# Patient Record
Sex: Male | Born: 1935
Health system: Southern US, Community
[De-identification: ages and names within clinical notes are randomized; demographics above are authoritative.]

## PROBLEM LIST (undated history)

## (undated) DIAGNOSIS — I10 Essential (primary) hypertension: Secondary | ICD-10-CM

## (undated) DIAGNOSIS — J45909 Unspecified asthma, uncomplicated: Secondary | ICD-10-CM

## (undated) DIAGNOSIS — Z789 Other specified health status: Secondary | ICD-10-CM

## (undated) HISTORY — DX: Other specified health status: Z78.9

## (undated) HISTORY — PX: COLONOSCOPY: SHX174

## (undated) HISTORY — DX: Unspecified asthma, uncomplicated: J45.909

---

## 2014-01-13 DIAGNOSIS — M7710 Lateral epicondylitis, unspecified elbow: Secondary | ICD-10-CM | POA: Diagnosis not present

## 2014-10-13 DIAGNOSIS — H2513 Age-related nuclear cataract, bilateral: Secondary | ICD-10-CM | POA: Diagnosis not present

## 2014-10-13 DIAGNOSIS — H35372 Puckering of macula, left eye: Secondary | ICD-10-CM | POA: Diagnosis not present

## 2015-02-02 DIAGNOSIS — R079 Chest pain, unspecified: Secondary | ICD-10-CM | POA: Diagnosis not present

## 2015-09-08 DIAGNOSIS — R1011 Right upper quadrant pain: Secondary | ICD-10-CM | POA: Diagnosis not present

## 2015-09-21 DIAGNOSIS — Z23 Encounter for immunization: Secondary | ICD-10-CM | POA: Diagnosis not present

## 2015-09-23 DIAGNOSIS — R1906 Epigastric swelling, mass or lump: Secondary | ICD-10-CM | POA: Diagnosis not present

## 2015-09-25 ENCOUNTER — Other Ambulatory Visit: Payer: Self-pay | Admitting: General Surgery

## 2015-09-25 DIAGNOSIS — R1906 Epigastric swelling, mass or lump: Secondary | ICD-10-CM

## 2015-10-06 ENCOUNTER — Ambulatory Visit
Admission: RE | Admit: 2015-10-06 | Discharge: 2015-10-06 | Disposition: A | Discharge status: Home / Self Care | Payer: Medicare Other | Source: Ambulatory Visit | Attending: General Surgery | Admitting: General Surgery

## 2015-10-06 DIAGNOSIS — R1906 Epigastric swelling, mass or lump: Secondary | ICD-10-CM

## 2015-10-06 MED ORDER — IOPAMIDOL (ISOVUE-300) INJECTION 61%
100.0000 mL | Freq: Once | INTRAVENOUS | Status: AC | PRN
  Administered 2015-10-06: 100 mL via INTRAVENOUS

## 2015-12-20 DIAGNOSIS — K469 Unspecified abdominal hernia without obstruction or gangrene: Secondary | ICD-10-CM | POA: Diagnosis not present

## 2015-12-20 DIAGNOSIS — N400 Enlarged prostate without lower urinary tract symptoms: Secondary | ICD-10-CM | POA: Diagnosis not present

## 2015-12-20 DIAGNOSIS — J45909 Unspecified asthma, uncomplicated: Secondary | ICD-10-CM | POA: Diagnosis not present

## 2015-12-20 DIAGNOSIS — L821 Other seborrheic keratosis: Secondary | ICD-10-CM | POA: Diagnosis not present

## 2015-12-20 DIAGNOSIS — K862 Cyst of pancreas: Secondary | ICD-10-CM | POA: Diagnosis not present

## 2015-12-20 DIAGNOSIS — L299 Pruritus, unspecified: Secondary | ICD-10-CM | POA: Diagnosis not present

## 2015-12-20 DIAGNOSIS — R972 Elevated prostate specific antigen [PSA]: Secondary | ICD-10-CM | POA: Diagnosis not present

## 2015-12-20 DIAGNOSIS — Z23 Encounter for immunization: Secondary | ICD-10-CM | POA: Diagnosis not present

## 2016-03-17 DIAGNOSIS — N400 Enlarged prostate without lower urinary tract symptoms: Secondary | ICD-10-CM | POA: Diagnosis not present

## 2016-03-17 DIAGNOSIS — Z13220 Encounter for screening for lipoid disorders: Secondary | ICD-10-CM | POA: Diagnosis not present

## 2016-03-17 DIAGNOSIS — Z0000 Encounter for general adult medical examination without abnormal findings: Secondary | ICD-10-CM | POA: Diagnosis not present

## 2016-03-17 DIAGNOSIS — K862 Cyst of pancreas: Secondary | ICD-10-CM | POA: Diagnosis not present

## 2016-03-17 DIAGNOSIS — J45909 Unspecified asthma, uncomplicated: Secondary | ICD-10-CM | POA: Diagnosis not present

## 2016-03-17 DIAGNOSIS — Z23 Encounter for immunization: Secondary | ICD-10-CM | POA: Diagnosis not present

## 2016-03-17 DIAGNOSIS — L821 Other seborrheic keratosis: Secondary | ICD-10-CM | POA: Diagnosis not present

## 2016-03-17 DIAGNOSIS — R972 Elevated prostate specific antigen [PSA]: Secondary | ICD-10-CM | POA: Diagnosis not present

## 2016-03-27 DIAGNOSIS — H2513 Age-related nuclear cataract, bilateral: Secondary | ICD-10-CM | POA: Diagnosis not present

## 2016-07-21 IMAGING — CT CT ABD-PELV W/ CM
2 of 5 series · 14 of 46 positions shown, 16 images · IV contrast (APPLIED)
Comparison: None.

ADDENDUM:
As mentioned in the body of the report, there is a 7 mm cystic
lesion in the body of pancreas. No evidence for associated
enhancement or substantial dilatation of the main pancreatic duct.
Followup MRI without and with contrast in 12 months is recommended
to further evaluate. This recommendation follows ACR consensus
guidelines: Managing Incidental Findings on Abdominal CT: White
Paper of the ACR Incidental Findings Committee. [HOSPITAL]
8010;[DATE]
CLINICAL DATA: Initial encounter for epigastric abdominal wall mass
on physical exam.

EXAM:
CT ABDOMEN AND PELVIS WITH CONTRAST
TECHNIQUE: Multidetector CT imaging of the abdomen and pelvis was performed
using the standard protocol following bolus administration of
intravenous contrast.
CONTRAST:  100mL 1BJCRJ-KOO IOPAMIDOL (1BJCRJ-KOO) INJECTION 61%

[Series 2: abd/pelvis w/cm · axial · 0.68mm/px · z∈[+357,+817]mm · 11 of 104 slices shown, 13 images]
[im 6/104  soft-tissue]
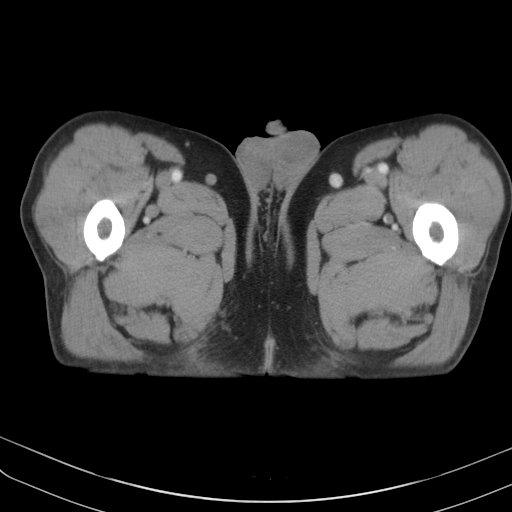
[im 6/104  bone]
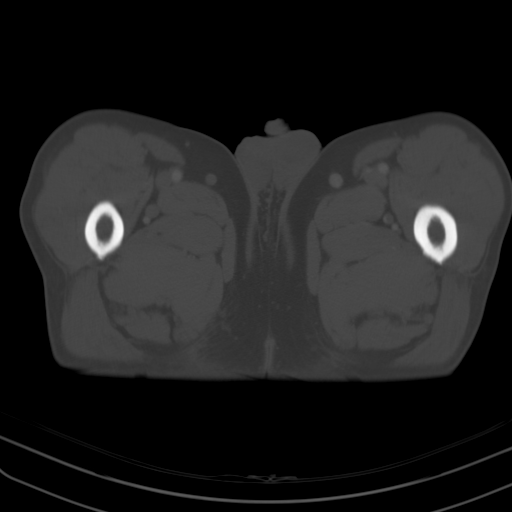
[im 16/104  soft-tissue]
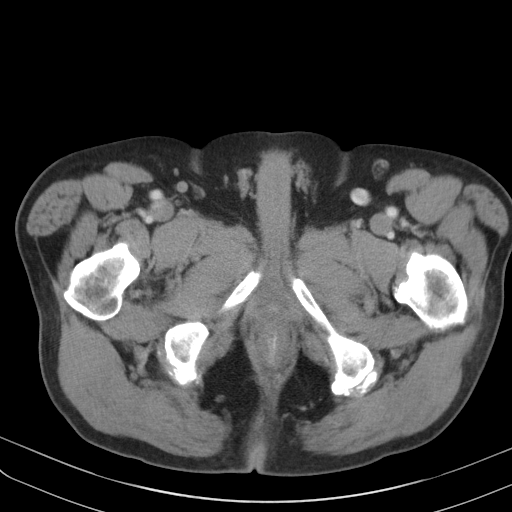
[im 26/104  soft-tissue]
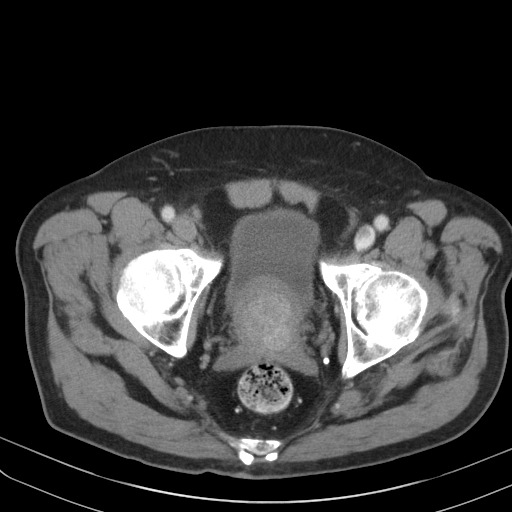
[im 37/104  soft-tissue]
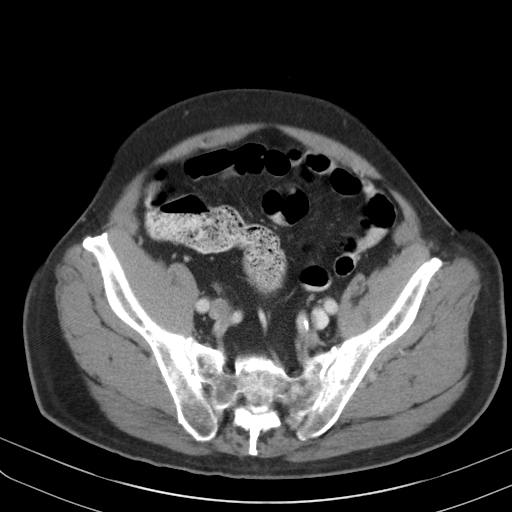
[im 42/104  soft-tissue]
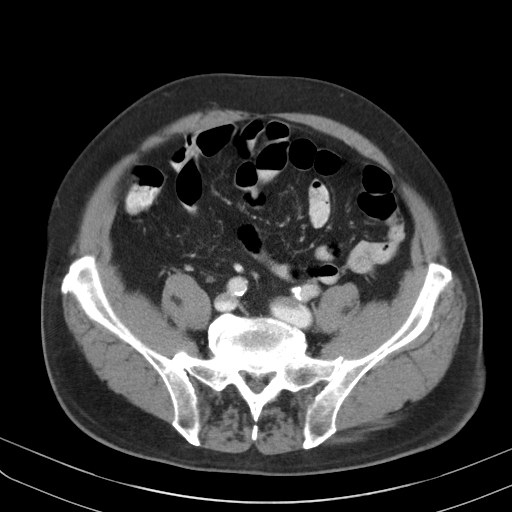
[im 52/104  soft-tissue]
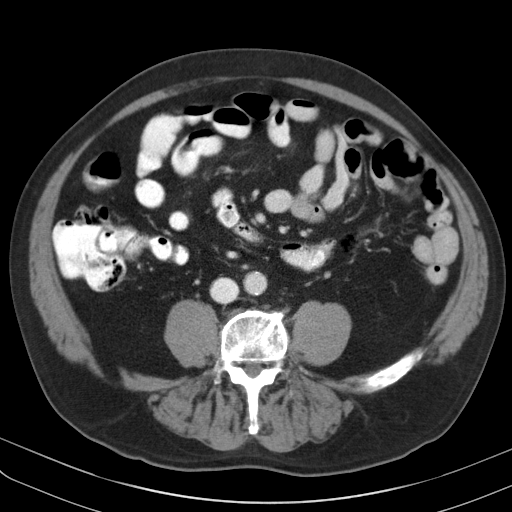
[im 62/104  soft-tissue]
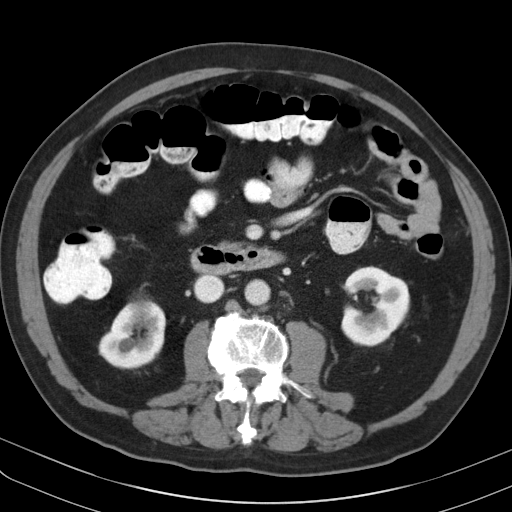
[im 67/104  soft-tissue]
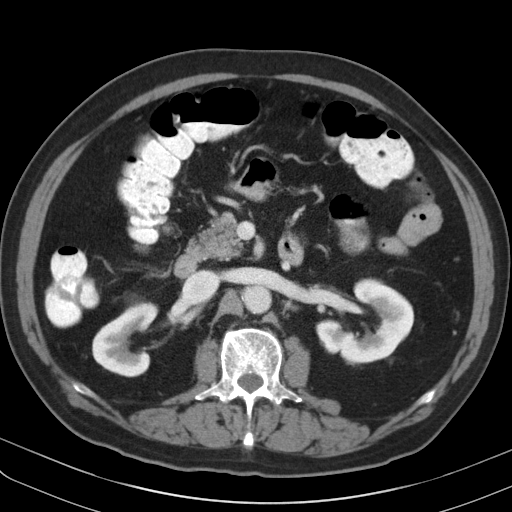
[im 78/104  soft-tissue]
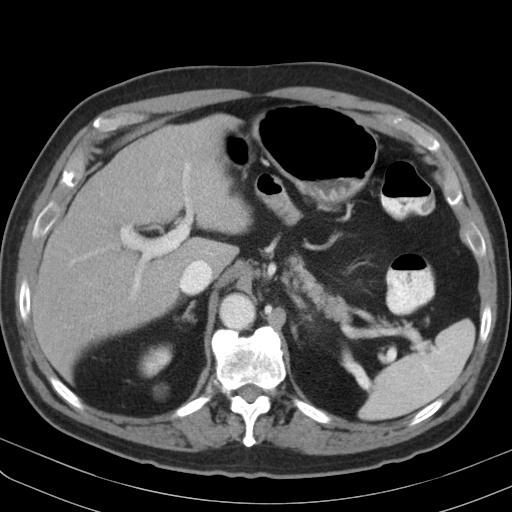
[im 78/104  bone]
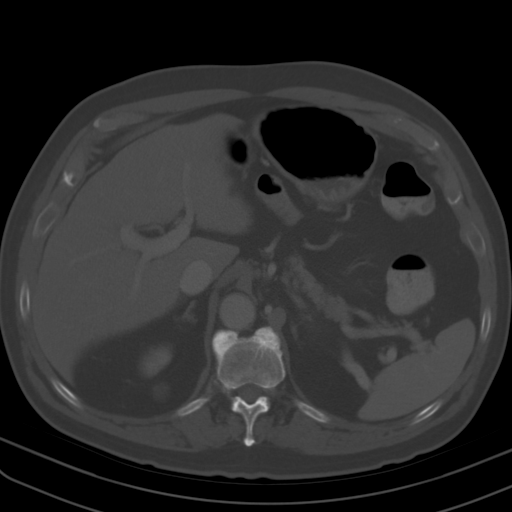
[im 88/104  soft-tissue]
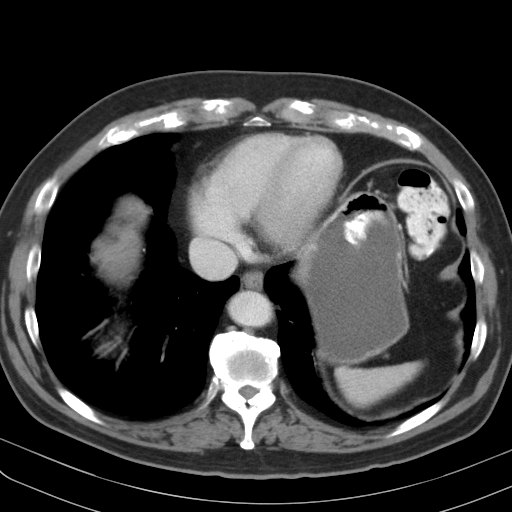
[im 98/104  soft-tissue]
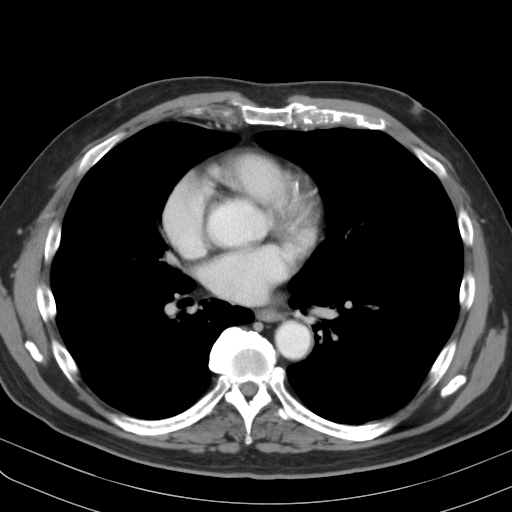

[Series 3: cor · coronal · 0.77mm/px · 3 of 96 slices shown]
[im 32/96  soft-tissue]
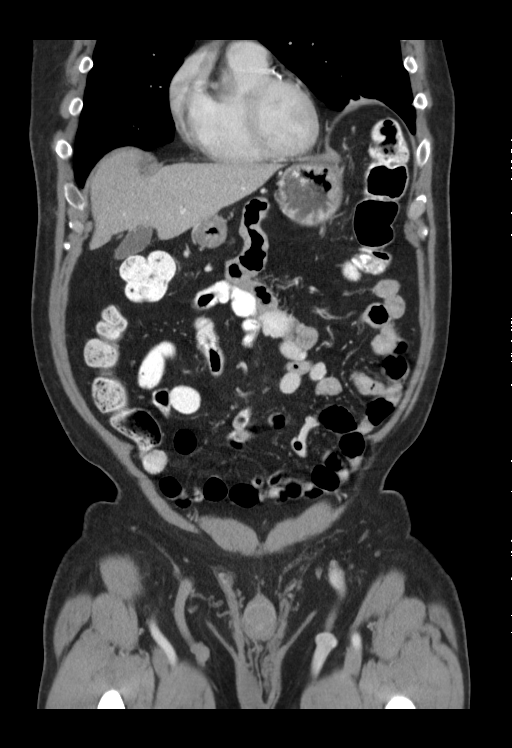
[im 43/96  soft-tissue]
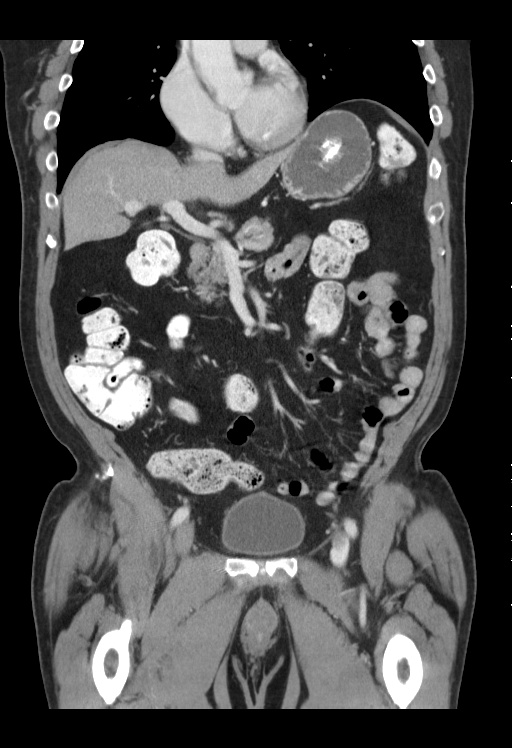
[im 53/96  soft-tissue]
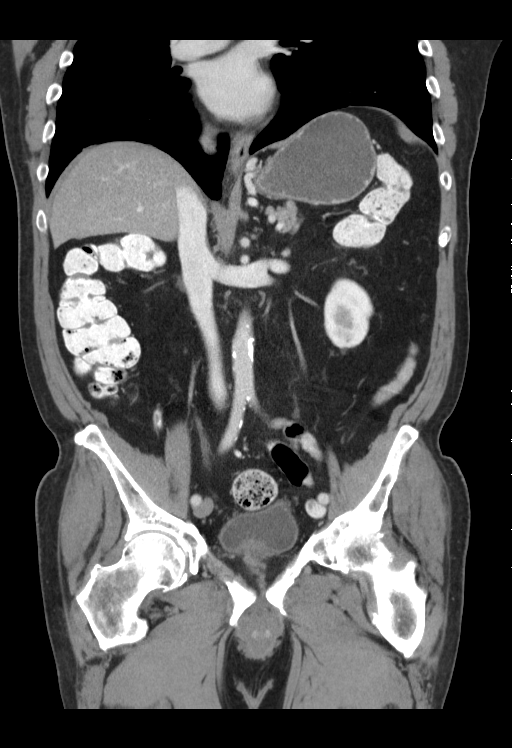

[14 of 46 positions shown; findings below may reference images not displayed]

FINDINGS: Lower chest: Subsegmental atelectasis or linear scarring is seen in
the lung bases bilaterally.

Hepatobiliary: Calcified granulomata within the parenchyma. Tiny
hypodensities in the left lobe measure up to about 7 mm diameter and
are too small to characterize but likely represent tiny cysts. There
is no evidence for gallstones, gallbladder wall thickening, or
pericholecystic fluid. No intrahepatic or extrahepatic biliary
dilation.

Pancreas: 7 mm cystic focus identified in the body of pancreas.

Spleen: No splenomegaly. No focal mass lesion.

Adrenals/Urinary Tract: No adrenal nodule or mass. 18 mm water
density lesion upper pole right kidney compatible with a cyst.
cm water density lesion interpolar right kidney also likely a cyst.
Other smaller well-defined low-density lesions in each kidney
compatible with cortical cyst. No evidence for hydroureter. The
urinary bladder appears normal for the degree of distention.

Stomach/Bowel: Stomach is nondistended. No gastric wall thickening.
No evidence of outlet obstruction. Duodenum is normally positioned
as is the ligament of Treitz. No small bowel wall thickening. No
small bowel dilatation. The terminal ileum is normal. The appendix
is normal. No gross colonic mass. No colonic wall thickening. No
substantial diverticular change.

Vascular/Lymphatic: There is abdominal aortic atherosclerosis
without aneurysm. No gastrohepatic or hepatoduodenal ligament
lymphadenopathy. There is some venous collateralization in the
medial left abdomen. Portal vein is not enlarged and is patent.
Superior mesenteric vein is patent. The splenic vein appears
diminutive.

Reproductive: Prostate gland is heterogeneous and enlarged.

Other: No intraperitoneal free fluid.

Musculoskeletal: A tiny midline ventral hernia is identified 7-8 cm
cranial to the umbilicus. This contains herniated omental fat with
the hernia sac measuring 1.6 x 0.8 x 1.7 cm. No edema or fluid
within the hernia to suggest fatty incarceration. Bone windows
reveal no worrisome lytic or sclerotic osseous lesions. Degenerative
changes are noted in the hips bilaterally.
IMPRESSION: 1. Small midline ventral hernia 7 - 8 cm cranial to the umbilicus
contains a small volume of herniated omental fat without
complicating features.
2. Left upper quadrant venous collateralization apparently secondary
to splenorenal shunt. The splenic vein is patent but diminutive.
Portal vein is patent.
3. Heterogeneous enlarged prostate gland.
4. Bilateral renal cysts with tiny bilateral renal lesions too small
to characterize, but statistically most likely related to cortical
cysts.

## 2016-10-02 DIAGNOSIS — M25562 Pain in left knee: Secondary | ICD-10-CM | POA: Diagnosis not present

## 2016-10-02 DIAGNOSIS — M1712 Unilateral primary osteoarthritis, left knee: Secondary | ICD-10-CM | POA: Diagnosis not present

## 2016-10-02 DIAGNOSIS — L989 Disorder of the skin and subcutaneous tissue, unspecified: Secondary | ICD-10-CM | POA: Diagnosis not present

## 2016-10-05 DIAGNOSIS — Z23 Encounter for immunization: Secondary | ICD-10-CM | POA: Diagnosis not present

## 2016-11-23 DIAGNOSIS — J069 Acute upper respiratory infection, unspecified: Secondary | ICD-10-CM | POA: Diagnosis not present

## 2016-11-29 DIAGNOSIS — I10 Essential (primary) hypertension: Secondary | ICD-10-CM | POA: Diagnosis not present

## 2016-11-29 DIAGNOSIS — R05 Cough: Secondary | ICD-10-CM | POA: Diagnosis not present

## 2016-12-05 DIAGNOSIS — H2513 Age-related nuclear cataract, bilateral: Secondary | ICD-10-CM | POA: Diagnosis not present

## 2016-12-05 DIAGNOSIS — I10 Essential (primary) hypertension: Secondary | ICD-10-CM | POA: Diagnosis not present

## 2016-12-12 DIAGNOSIS — I10 Essential (primary) hypertension: Secondary | ICD-10-CM | POA: Diagnosis not present

## 2017-01-11 DIAGNOSIS — L989 Disorder of the skin and subcutaneous tissue, unspecified: Secondary | ICD-10-CM | POA: Diagnosis not present

## 2017-01-11 DIAGNOSIS — I10 Essential (primary) hypertension: Secondary | ICD-10-CM | POA: Diagnosis not present

## 2017-02-08 DIAGNOSIS — J45909 Unspecified asthma, uncomplicated: Secondary | ICD-10-CM | POA: Diagnosis not present

## 2017-02-08 DIAGNOSIS — L820 Inflamed seborrheic keratosis: Secondary | ICD-10-CM | POA: Diagnosis not present

## 2017-02-09 DIAGNOSIS — L820 Inflamed seborrheic keratosis: Secondary | ICD-10-CM | POA: Diagnosis not present

## 2017-04-09 DIAGNOSIS — H2513 Age-related nuclear cataract, bilateral: Secondary | ICD-10-CM | POA: Diagnosis not present

## 2017-04-13 DIAGNOSIS — I10 Essential (primary) hypertension: Secondary | ICD-10-CM | POA: Diagnosis not present

## 2017-04-13 DIAGNOSIS — J45909 Unspecified asthma, uncomplicated: Secondary | ICD-10-CM | POA: Diagnosis not present

## 2017-07-03 DIAGNOSIS — L03115 Cellulitis of right lower limb: Secondary | ICD-10-CM | POA: Diagnosis not present

## 2017-09-17 DIAGNOSIS — Z23 Encounter for immunization: Secondary | ICD-10-CM | POA: Diagnosis not present

## 2017-10-08 DIAGNOSIS — Z23 Encounter for immunization: Secondary | ICD-10-CM | POA: Diagnosis not present

## 2017-11-19 DIAGNOSIS — K0510 Chronic gingivitis, plaque induced: Secondary | ICD-10-CM | POA: Diagnosis not present

## 2017-12-14 DIAGNOSIS — R972 Elevated prostate specific antigen [PSA]: Secondary | ICD-10-CM | POA: Diagnosis not present

## 2017-12-14 DIAGNOSIS — I10 Essential (primary) hypertension: Secondary | ICD-10-CM | POA: Diagnosis not present

## 2017-12-14 DIAGNOSIS — I8392 Asymptomatic varicose veins of left lower extremity: Secondary | ICD-10-CM | POA: Diagnosis not present

## 2017-12-14 DIAGNOSIS — J45909 Unspecified asthma, uncomplicated: Secondary | ICD-10-CM | POA: Diagnosis not present

## 2017-12-14 DIAGNOSIS — L821 Other seborrheic keratosis: Secondary | ICD-10-CM | POA: Diagnosis not present

## 2017-12-14 DIAGNOSIS — Z0000 Encounter for general adult medical examination without abnormal findings: Secondary | ICD-10-CM | POA: Diagnosis not present

## 2017-12-14 DIAGNOSIS — N400 Enlarged prostate without lower urinary tract symptoms: Secondary | ICD-10-CM | POA: Diagnosis not present

## 2017-12-14 DIAGNOSIS — M1712 Unilateral primary osteoarthritis, left knee: Secondary | ICD-10-CM | POA: Diagnosis not present

## 2017-12-14 DIAGNOSIS — K862 Cyst of pancreas: Secondary | ICD-10-CM | POA: Diagnosis not present

## 2017-12-14 DIAGNOSIS — H2513 Age-related nuclear cataract, bilateral: Secondary | ICD-10-CM | POA: Diagnosis not present

## 2018-02-11 ENCOUNTER — Other Ambulatory Visit (HOSPITAL_BASED_OUTPATIENT_CLINIC_OR_DEPARTMENT_OTHER): Payer: Self-pay | Admitting: Family Medicine

## 2018-02-11 DIAGNOSIS — M7989 Other specified soft tissue disorders: Secondary | ICD-10-CM | POA: Diagnosis not present

## 2018-02-11 DIAGNOSIS — M79605 Pain in left leg: Secondary | ICD-10-CM | POA: Diagnosis not present

## 2018-02-11 DIAGNOSIS — I8393 Asymptomatic varicose veins of bilateral lower extremities: Secondary | ICD-10-CM | POA: Diagnosis not present

## 2018-02-12 ENCOUNTER — Ambulatory Visit (HOSPITAL_BASED_OUTPATIENT_CLINIC_OR_DEPARTMENT_OTHER)
Admission: RE | Admit: 2018-02-12 | Discharge: 2018-02-12 | Disposition: A | Discharge status: Home / Self Care | Payer: Medicare Other | Source: Ambulatory Visit | Attending: Family Medicine | Admitting: Family Medicine

## 2018-02-12 DIAGNOSIS — M79605 Pain in left leg: Secondary | ICD-10-CM

## 2018-02-12 DIAGNOSIS — M7122 Synovial cyst of popliteal space [Baker], left knee: Secondary | ICD-10-CM | POA: Diagnosis not present

## 2018-02-12 DIAGNOSIS — M79662 Pain in left lower leg: Secondary | ICD-10-CM | POA: Diagnosis not present

## 2018-02-12 DIAGNOSIS — M7989 Other specified soft tissue disorders: Secondary | ICD-10-CM | POA: Diagnosis not present

## 2018-02-18 DIAGNOSIS — M7122 Synovial cyst of popliteal space [Baker], left knee: Secondary | ICD-10-CM | POA: Diagnosis not present

## 2018-04-18 DIAGNOSIS — H2513 Age-related nuclear cataract, bilateral: Secondary | ICD-10-CM | POA: Diagnosis not present

## 2018-06-12 DIAGNOSIS — I8392 Asymptomatic varicose veins of left lower extremity: Secondary | ICD-10-CM | POA: Diagnosis not present

## 2018-06-12 DIAGNOSIS — N400 Enlarged prostate without lower urinary tract symptoms: Secondary | ICD-10-CM | POA: Diagnosis not present

## 2018-06-12 DIAGNOSIS — I10 Essential (primary) hypertension: Secondary | ICD-10-CM | POA: Diagnosis not present

## 2018-06-12 DIAGNOSIS — R739 Hyperglycemia, unspecified: Secondary | ICD-10-CM | POA: Diagnosis not present

## 2018-06-12 DIAGNOSIS — J45909 Unspecified asthma, uncomplicated: Secondary | ICD-10-CM | POA: Diagnosis not present

## 2018-06-12 DIAGNOSIS — M7122 Synovial cyst of popliteal space [Baker], left knee: Secondary | ICD-10-CM | POA: Diagnosis not present

## 2018-08-31 DIAGNOSIS — M19079 Primary osteoarthritis, unspecified ankle and foot: Secondary | ICD-10-CM | POA: Diagnosis not present

## 2018-09-30 DIAGNOSIS — Z23 Encounter for immunization: Secondary | ICD-10-CM | POA: Diagnosis not present

## 2021-02-21 DIAGNOSIS — M2040 Other hammer toe(s) (acquired), unspecified foot: Secondary | ICD-10-CM | POA: Diagnosis not present

## 2021-02-21 DIAGNOSIS — Z0000 Encounter for general adult medical examination without abnormal findings: Secondary | ICD-10-CM | POA: Diagnosis not present

## 2021-02-21 DIAGNOSIS — I739 Peripheral vascular disease, unspecified: Secondary | ICD-10-CM | POA: Diagnosis not present

## 2021-02-21 DIAGNOSIS — R739 Hyperglycemia, unspecified: Secondary | ICD-10-CM | POA: Diagnosis not present

## 2021-02-21 DIAGNOSIS — J45909 Unspecified asthma, uncomplicated: Secondary | ICD-10-CM | POA: Diagnosis not present

## 2021-02-21 DIAGNOSIS — I10 Essential (primary) hypertension: Secondary | ICD-10-CM | POA: Diagnosis not present

## 2021-02-21 DIAGNOSIS — K862 Cyst of pancreas: Secondary | ICD-10-CM | POA: Diagnosis not present

## 2021-07-06 DIAGNOSIS — U071 COVID-19: Secondary | ICD-10-CM | POA: Diagnosis not present

## 2021-07-06 DIAGNOSIS — R059 Cough, unspecified: Secondary | ICD-10-CM | POA: Diagnosis not present

## 2021-07-06 DIAGNOSIS — R509 Fever, unspecified: Secondary | ICD-10-CM | POA: Diagnosis not present

## 2021-08-26 DIAGNOSIS — R739 Hyperglycemia, unspecified: Secondary | ICD-10-CM | POA: Diagnosis not present

## 2021-08-26 DIAGNOSIS — K862 Cyst of pancreas: Secondary | ICD-10-CM | POA: Diagnosis not present

## 2021-08-26 DIAGNOSIS — U071 COVID-19: Secondary | ICD-10-CM | POA: Diagnosis not present

## 2021-08-26 DIAGNOSIS — J45909 Unspecified asthma, uncomplicated: Secondary | ICD-10-CM | POA: Diagnosis not present

## 2021-08-26 DIAGNOSIS — M7989 Other specified soft tissue disorders: Secondary | ICD-10-CM | POA: Diagnosis not present

## 2021-08-26 DIAGNOSIS — I8392 Asymptomatic varicose veins of left lower extremity: Secondary | ICD-10-CM | POA: Diagnosis not present

## 2021-08-26 DIAGNOSIS — I10 Essential (primary) hypertension: Secondary | ICD-10-CM | POA: Diagnosis not present

## 2021-08-26 DIAGNOSIS — M2040 Other hammer toe(s) (acquired), unspecified foot: Secondary | ICD-10-CM | POA: Diagnosis not present

## 2021-08-26 DIAGNOSIS — I739 Peripheral vascular disease, unspecified: Secondary | ICD-10-CM | POA: Diagnosis not present

## 2022-01-24 DIAGNOSIS — I700 Atherosclerosis of aorta: Secondary | ICD-10-CM | POA: Diagnosis not present

## 2022-01-24 DIAGNOSIS — I10 Essential (primary) hypertension: Secondary | ICD-10-CM | POA: Diagnosis not present

## 2022-01-24 DIAGNOSIS — Z0000 Encounter for general adult medical examination without abnormal findings: Secondary | ICD-10-CM | POA: Diagnosis not present

## 2022-01-24 DIAGNOSIS — I83892 Varicose veins of left lower extremities with other complications: Secondary | ICD-10-CM | POA: Diagnosis not present

## 2022-01-24 DIAGNOSIS — E663 Overweight: Secondary | ICD-10-CM | POA: Diagnosis not present

## 2022-01-24 DIAGNOSIS — Z6827 Body mass index (BMI) 27.0-27.9, adult: Secondary | ICD-10-CM | POA: Diagnosis not present

## 2022-02-02 DIAGNOSIS — Z79899 Other long term (current) drug therapy: Secondary | ICD-10-CM | POA: Diagnosis not present

## 2022-02-02 DIAGNOSIS — I83892 Varicose veins of left lower extremities with other complications: Secondary | ICD-10-CM | POA: Diagnosis not present

## 2022-02-02 DIAGNOSIS — Z0001 Encounter for general adult medical examination with abnormal findings: Secondary | ICD-10-CM | POA: Diagnosis not present

## 2022-02-02 DIAGNOSIS — Z6827 Body mass index (BMI) 27.0-27.9, adult: Secondary | ICD-10-CM | POA: Diagnosis not present

## 2022-02-02 DIAGNOSIS — I700 Atherosclerosis of aorta: Secondary | ICD-10-CM | POA: Diagnosis not present

## 2022-02-02 DIAGNOSIS — I10 Essential (primary) hypertension: Secondary | ICD-10-CM | POA: Diagnosis not present

## 2022-02-02 DIAGNOSIS — Z1159 Encounter for screening for other viral diseases: Secondary | ICD-10-CM | POA: Diagnosis not present

## 2022-02-02 DIAGNOSIS — Z0000 Encounter for general adult medical examination without abnormal findings: Secondary | ICD-10-CM | POA: Diagnosis not present

## 2022-02-02 DIAGNOSIS — E663 Overweight: Secondary | ICD-10-CM | POA: Diagnosis not present

## 2022-02-09 DIAGNOSIS — I10 Essential (primary) hypertension: Secondary | ICD-10-CM | POA: Diagnosis not present

## 2022-02-09 DIAGNOSIS — E663 Overweight: Secondary | ICD-10-CM | POA: Diagnosis not present

## 2022-02-09 DIAGNOSIS — Z6827 Body mass index (BMI) 27.0-27.9, adult: Secondary | ICD-10-CM | POA: Diagnosis not present

## 2022-02-09 DIAGNOSIS — Z79899 Other long term (current) drug therapy: Secondary | ICD-10-CM | POA: Diagnosis not present

## 2022-12-22 ENCOUNTER — Other Ambulatory Visit (HOSPITAL_BASED_OUTPATIENT_CLINIC_OR_DEPARTMENT_OTHER): Payer: Self-pay | Admitting: Family Medicine

## 2022-12-22 DIAGNOSIS — R059 Cough, unspecified: Secondary | ICD-10-CM

## 2022-12-27 ENCOUNTER — Ambulatory Visit
Admission: RE | Admit: 2022-12-27 | Discharge: 2022-12-27 | Disposition: A | Discharge status: Home / Self Care | Payer: Medicare PPO | Source: Ambulatory Visit | Attending: Family Medicine | Admitting: Family Medicine

## 2022-12-27 DIAGNOSIS — R059 Cough, unspecified: Secondary | ICD-10-CM

## 2022-12-29 ENCOUNTER — Other Ambulatory Visit (HOSPITAL_BASED_OUTPATIENT_CLINIC_OR_DEPARTMENT_OTHER): Payer: Self-pay | Admitting: Family Medicine

## 2022-12-29 DIAGNOSIS — D491 Neoplasm of unspecified behavior of respiratory system: Secondary | ICD-10-CM

## 2023-01-03 ENCOUNTER — Ambulatory Visit (HOSPITAL_BASED_OUTPATIENT_CLINIC_OR_DEPARTMENT_OTHER): Admission: RE | Admit: 2023-01-03 | Payer: Medicare PPO | Source: Ambulatory Visit

## 2023-01-06 ENCOUNTER — Ambulatory Visit (HOSPITAL_BASED_OUTPATIENT_CLINIC_OR_DEPARTMENT_OTHER)
Admission: RE | Admit: 2023-01-06 | Discharge: 2023-01-06 | Disposition: A | Discharge status: Home / Self Care | Payer: Medicare PPO | Source: Ambulatory Visit | Attending: Family Medicine | Admitting: Family Medicine

## 2023-01-06 DIAGNOSIS — D491 Neoplasm of unspecified behavior of respiratory system: Secondary | ICD-10-CM | POA: Insufficient documentation

## 2023-01-06 MED ORDER — IOHEXOL 300 MG/ML  SOLN
100.0000 mL | Freq: Once | INTRAMUSCULAR | Status: AC | PRN
  Administered 2023-01-06: 80 mL via INTRAVENOUS

## 2023-01-10 ENCOUNTER — Other Ambulatory Visit: Payer: Self-pay | Admitting: Family Medicine

## 2023-01-10 DIAGNOSIS — D491 Neoplasm of unspecified behavior of respiratory system: Secondary | ICD-10-CM

## 2023-01-12 ENCOUNTER — Encounter: Payer: Self-pay | Admitting: Family Medicine

## 2023-01-18 ENCOUNTER — Ambulatory Visit
Admission: RE | Admit: 2023-01-18 | Discharge: 2023-01-18 | Disposition: A | Discharge status: Home / Self Care | Payer: Medicare PPO | Source: Ambulatory Visit | Attending: Family Medicine | Admitting: Family Medicine

## 2023-01-18 DIAGNOSIS — D491 Neoplasm of unspecified behavior of respiratory system: Secondary | ICD-10-CM

## 2023-01-18 MED ORDER — IOPAMIDOL (ISOVUE-300) INJECTION 61%
75.0000 mL | Freq: Once | INTRAVENOUS | Status: AC | PRN
  Administered 2023-01-18: 75 mL via INTRAVENOUS

## 2023-01-25 NOTE — H&P (View-Only) (Signed)
 Synopsis: Referred in March 2024 for lung mass by Paliwal, Himanshu, MD  Subjective:   PATIENT ID: Bruce Valdez GENDER: male DOB: 11/11/1935, MRN: [id number]  Chief Complaint  Patient presents with   Consult    Lung nodule.    This is an 87-year-old gentleman Referred after having CT imaging of the chest completed on 01/18/2023.  Patient was found to have a large conglomerate of masses within the right chest measuring largest cross-section at 9.7 cm.  Additionally there was interlobular septal thickening concerning for lymphangitic spread and subcarinal adenopathy.  Patient has ongoing cough.  That is what led to his evaluation.  He otherwise has never been hospitalized in his life had intermittent asthma symptoms many years ago.  He takes no medications.  He is a lifelong non-smoker.  Has no family history of lung cancer and he came from [place] to the [place] after [place].    Past Medical History:  Diagnosis Date   Asthma    Nonsmoker      Family History  Problem Relation Age of Onset   Lung cancer Neg Hx      Past Surgical History:  Procedure Laterality Date   COLONOSCOPY      Social History   Socioeconomic History   Marital status: Married    Spouse name: Not on file   Number of children: Not on file   Years of education: Not on file   Highest education level: Not on file  Occupational History   Not on file  Tobacco Use   Smoking status: Never    Passive exposure: Never   Smokeless tobacco: Never  Substance and Sexual Activity   Alcohol use: Not on file   Drug use: Not on file   Sexual activity: Not on file  Other Topics Concern   Not on file  Social History Narrative   Not on file   Social Determinants of Health   Financial Resource Strain: Not on file  Food Insecurity: Not on file  Transportation Needs: Not on file  Physical Activity: Not on file  Stress: Not on file  Social Connections: Not on file  Intimate Partner Violence: Not on  file     Not on File   Outpatient Medications Prior to Visit  Medication Sig Dispense Refill   amLODipine (NORVASC) 10 MG tablet Take 10 mg by mouth daily.     dutasteride (AVODART) 0.5 MG capsule Take 0.5 mg by mouth daily.     promethazine-dextromethorphan (PROMETHAZINE-DM) 6.25-15 MG/5ML syrup SMARTSIG:10 Milliliter(s) By Mouth Every 8 Hours PRN     tamsulosin (FLOMAX) 0.4 MG CAPS capsule Take 0.4 mg by mouth daily.     No facility-administered medications prior to visit.    Review of Systems  Constitutional:  Negative for chills, fever, malaise/fatigue and weight loss.  HENT:  Negative for hearing loss, sore throat and tinnitus.   Eyes:  Negative for blurred vision and double vision.  Respiratory:  Positive for cough. Negative for hemoptysis, sputum production, shortness of breath, wheezing and stridor.   Cardiovascular:  Negative for chest pain, palpitations, orthopnea, leg swelling and PND.  Gastrointestinal:  Negative for abdominal pain, constipation, diarrhea, heartburn, nausea and vomiting.  Genitourinary:  Negative for dysuria, hematuria and urgency.  Musculoskeletal:  Negative for joint pain and myalgias.  Skin:  Negative for itching and rash.  Neurological:  Negative for dizziness, tingling, weakness and headaches.  Endo/Heme/Allergies:  Negative for environmental allergies. Does not bruise/bleed easily.    Psychiatric/Behavioral:  Negative for depression. The patient is not nervous/anxious and does not have insomnia.   All other systems reviewed and are negative.    Objective:  Physical Exam Vitals reviewed.  Constitutional:      General: He is not in acute distress.    Appearance: He is well-developed.  HENT:     Head: Normocephalic and atraumatic.  Eyes:     General: No scleral icterus.    Conjunctiva/sclera: Conjunctivae normal.     Pupils: Pupils are equal, round, and reactive to light.  Neck:     Vascular: No JVD.     Trachea: No tracheal deviation.   Cardiovascular:     Rate and Rhythm: Normal rate and regular rhythm.     Heart sounds: Normal heart sounds. No murmur heard. Pulmonary:     Effort: Pulmonary effort is normal. No tachypnea, accessory muscle usage or respiratory distress.     Breath sounds: No stridor. Rhonchi present. No wheezing or rales.  Abdominal:     General: There is no distension.     Palpations: Abdomen is soft.     Tenderness: There is no abdominal tenderness.  Musculoskeletal:        General: No tenderness.     Cervical back: Neck supple.  Lymphadenopathy:     Cervical: No cervical adenopathy.  Skin:    General: Skin is warm and dry.     Capillary Refill: Capillary refill takes less than 2 seconds.     Findings: No rash.  Neurological:     Mental Status: He is alert and oriented to person, place, and time.  Psychiatric:        Behavior: Behavior normal.      Vitals:   01/26/23 1001  BP: (!) 90/50  Pulse: (!) 125  SpO2: 93%  Weight: 139 lb 9.6 oz (63.3 kg)  Height: 5' 2" (1.575 m)   93% on RA BMI Readings from Last 3 Encounters:  01/26/23 25.53 kg/m   Wt Readings from Last 3 Encounters:  01/26/23 139 lb 9.6 oz (63.3 kg)     CBC No results found for: "WBC", "RBC", "HGB", "HCT", "PLT", "MCV", "MCH", "MCHC", "RDW", "LYMPHSABS", "MONOABS", "EOSABS", "BASOSABS"   Chest Imaging:  CT chest February 2024: Right-sided lung mass with associated adenopathy concerning for advanced age bronchogenic carcinoma. The patient's images have been independently reviewed by me.    Pulmonary Functions Testing Results:     No data to display          FeNO:   Pathology:   Echocardiogram:   Heart Catheterization:     Assessment & Plan:     ICD-10-CM   1. Lung mass  R91.8 QuantiFERON-TB Gold Plus    CBC w/Diff    Comp Met (CMET)    Procedural/ Surgical Case Request: ROBOTIC ASSISTED NAVIGATIONAL BRONCHOSCOPY, VIDEO BRONCHOSCOPY WITH ENDOBRONCHIAL ULTRASOUND    Ambulatory referral to  Pulmonology    2. Nonsmoker  Z78.9     3. Adenopathy  R59.9       Discussion:  This is a 87-year-old gentleman, newly diagnosed lung mass with associated adenopathy concerning for an advanced age bronchogenic carcinoma.  He is from [place] and is a non-smoker.  Plan: We will need to send off his guardant NGS 360 CDX.  These patients are high risk for mutations being present such as EGFR.  We will plan for bronchoscopy on 02/06/2023. Today in the office we talked about the risk of bleeding and pneumothorax. We talk about   the risk of undergoing robotic assisted navigational bronchoscopy and videobronchoscopy with endobronchial ultrasound. We will definitely need enough tissue for molecular testing. I will schedule him for robotic case to ensure that we can biopsy the primary tumor site.  The adenopathy does have is small and may not yield enough cellular material for molecular diagnostics.    Current Outpatient Medications:    amLODipine (NORVASC) 10 MG tablet, Take 10 mg by mouth daily., Disp: , Rfl:    dutasteride (AVODART) 0.5 MG capsule, Take 0.5 mg by mouth daily., Disp: , Rfl:    promethazine-dextromethorphan (PROMETHAZINE-DM) 6.25-15 MG/5ML syrup, SMARTSIG:10 Milliliter(s) By Mouth Every 8 Hours PRN, Disp: , Rfl:    tamsulosin (FLOMAX) 0.4 MG CAPS capsule, Take 0.4 mg by mouth daily., Disp: , Rfl:   I spent 62 minutes dedicated to the care of this patient on the date of this encounter to include pre-visit review of records, face-to-face time with the patient discussing conditions above, post visit ordering of testing, clinical documentation with the electronic health record, making appropriate referrals as documented, and communicating necessary findings to members of the patients care team.   Daniel Johndrow L Oni Dietzman, DO [facility] 01/26/2023 10:23 AM    

## 2023-01-25 NOTE — Progress Notes (Signed)
Synopsis: Referred in March 2024 for lung mass by Loura Pardon, MD  Subjective:   PATIENT ID: Bruce Valdez GENDER: male DOB: 06/02/1935, MRN: [id number]  Chief Complaint  Patient presents with   Consult    Lung nodule.    This is an 88 year old gentleman Referred after having CT imaging of the chest completed on 01/18/2023.  Patient was found to have a large conglomerate of masses within the right chest measuring largest cross-section at 9.7 cm.  Additionally there was interlobular septal thickening concerning for lymphangitic spread and subcarinal adenopathy.  Patient has ongoing cough.  That is what led to his evaluation.  He otherwise has never been hospitalized in his life had intermittent asthma symptoms many years ago.  He takes no medications.  He is a lifelong non-smoker.  Has no family history of lung cancer and he came from [place] to the [place] after World War II.    Past Medical History:  Diagnosis Date   Asthma    Nonsmoker      Family History  Problem Relation Age of Onset   Lung cancer Neg Hx      Past Surgical History:  Procedure Laterality Date   COLONOSCOPY      Social History   Socioeconomic History   Marital status: Married    Spouse name: Not on file   Number of children: Not on file   Years of education: Not on file   Highest education level: Not on file  Occupational History   Not on file  Tobacco Use   Smoking status: Never    Passive exposure: Never   Smokeless tobacco: Never  Substance and Sexual Activity   Alcohol use: Not on file   Drug use: Not on file   Sexual activity: Not on file  Other Topics Concern   Not on file  Social History Narrative   Not on file   Social Determinants of Health   Financial Resource Strain: Not on file  Food Insecurity: Not on file  Transportation Needs: Not on file  Physical Activity: Not on file  Stress: Not on file  Social Connections: Not on file  Intimate Partner Violence: Not on  file     Not on File   Outpatient Medications Prior to Visit  Medication Sig Dispense Refill   amLODipine (NORVASC) 10 MG tablet Take 10 mg by mouth daily.     dutasteride (AVODART) 0.5 MG capsule Take 0.5 mg by mouth daily.     promethazine-dextromethorphan (PROMETHAZINE-DM) 6.25-15 MG/5ML syrup SMARTSIG:10 Milliliter(s) By Mouth Every 8 Hours PRN     tamsulosin (FLOMAX) 0.4 MG CAPS capsule Take 0.4 mg by mouth daily.     No facility-administered medications prior to visit.    Review of Systems  Constitutional:  Negative for chills, fever, malaise/fatigue and weight loss.  HENT:  Negative for hearing loss, sore throat and tinnitus.   Eyes:  Negative for blurred vision and double vision.  Respiratory:  Positive for cough. Negative for hemoptysis, sputum production, shortness of breath, wheezing and stridor.   Cardiovascular:  Negative for chest pain, palpitations, orthopnea, leg swelling and PND.  Gastrointestinal:  Negative for abdominal pain, constipation, diarrhea, heartburn, nausea and vomiting.  Genitourinary:  Negative for dysuria, hematuria and urgency.  Musculoskeletal:  Negative for joint pain and myalgias.  Skin:  Negative for itching and rash.  Neurological:  Negative for dizziness, tingling, weakness and headaches.  Endo/Heme/Allergies:  Negative for environmental allergies. Does not bruise/bleed easily.  Psychiatric/Behavioral:  Negative for depression. The patient is not nervous/anxious and does not have insomnia.   All other systems reviewed and are negative.    Objective:  Physical Exam Vitals reviewed.  Constitutional:      General: He is not in acute distress.    Appearance: He is well-developed.  HENT:     Head: Normocephalic and atraumatic.  Eyes:     General: No scleral icterus.    Conjunctiva/sclera: Conjunctivae normal.     Pupils: Pupils are equal, round, and reactive to light.  Neck:     Vascular: No JVD.     Trachea: No tracheal deviation.   Cardiovascular:     Rate and Rhythm: Normal rate and regular rhythm.     Heart sounds: Normal heart sounds. No murmur heard. Pulmonary:     Effort: Pulmonary effort is normal. No tachypnea, accessory muscle usage or respiratory distress.     Breath sounds: No stridor. Rhonchi present. No wheezing or rales.  Abdominal:     General: There is no distension.     Palpations: Abdomen is soft.     Tenderness: There is no abdominal tenderness.  Musculoskeletal:        General: No tenderness.     Cervical back: Neck supple.  Lymphadenopathy:     Cervical: No cervical adenopathy.  Skin:    General: Skin is warm and dry.     Capillary Refill: Capillary refill takes less than 2 seconds.     Findings: No rash.  Neurological:     Mental Status: He is alert and oriented to person, place, and time.  Psychiatric:        Behavior: Behavior normal.      Vitals:   01/26/23 1001  BP: (!) 90/50  Pulse: (!) 125  SpO2: 93%  Weight: 139 lb 9.6 oz (63.3 kg)  Height: '5\' 2"'$  (1.575 m)   93% on RA BMI Readings from Last 3 Encounters:  01/26/23 25.53 kg/m   Wt Readings from Last 3 Encounters:  01/26/23 139 lb 9.6 oz (63.3 kg)     CBC No results found for: "WBC", "RBC", "HGB", "HCT", "PLT", "MCV", "MCH", "MCHC", "RDW", "LYMPHSABS", "MONOABS", "EOSABS", "BASOSABS"   Chest Imaging:  CT chest February 2024: Right-sided lung mass with associated adenopathy concerning for advanced age bronchogenic carcinoma. The patient's images have been independently reviewed by me.    Pulmonary Functions Testing Results:     No data to display          FeNO:   Pathology:   Echocardiogram:   Heart Catheterization:     Assessment & Plan:     ICD-10-CM   1. Lung mass  R91.8 QuantiFERON-TB Gold Plus    CBC w/Diff    Comp Met (CMET)    Procedural/ Surgical Case Request: ROBOTIC ASSISTED NAVIGATIONAL BRONCHOSCOPY, VIDEO BRONCHOSCOPY WITH ENDOBRONCHIAL ULTRASOUND    Ambulatory referral to  Pulmonology    2. Nonsmoker  Z78.9     3. Adenopathy  R59.9       Discussion:  This is a 88 year old gentleman, newly diagnosed lung mass with associated adenopathy concerning for an advanced age bronchogenic carcinoma.  He is from [place] and is a non-smoker.  Plan: We will need to send off his guardant NGS 360 CDX.  These patients are high risk for mutations being present such as EGFR.  We will plan for bronchoscopy on 02/06/2023. Today in the office we talked about the risk of bleeding and pneumothorax. We talk about  the risk of undergoing robotic assisted navigational bronchoscopy and videobronchoscopy with endobronchial ultrasound. We will definitely need enough tissue for molecular testing. I will schedule him for robotic case to ensure that we can biopsy the primary tumor site.  The adenopathy does have is small and may not yield enough cellular material for molecular diagnostics.    Current Outpatient Medications:    amLODipine (NORVASC) 10 MG tablet, Take 10 mg by mouth daily., Disp: , Rfl:    dutasteride (AVODART) 0.5 MG capsule, Take 0.5 mg by mouth daily., Disp: , Rfl:    promethazine-dextromethorphan (PROMETHAZINE-DM) 6.25-15 MG/5ML syrup, SMARTSIG:10 Milliliter(s) By Mouth Every 8 Hours PRN, Disp: , Rfl:    tamsulosin (FLOMAX) 0.4 MG CAPS capsule, Take 0.4 mg by mouth daily., Disp: , Rfl:   I spent 62 minutes dedicated to the care of this patient on the date of this encounter to include pre-visit review of records, face-to-face time with the patient discussing conditions above, post visit ordering of testing, clinical documentation with the electronic health record, making appropriate referrals as documented, and communicating necessary findings to members of the patients care team.   Garner Nash, DO [facility] 01/26/2023 10:23 AM

## 2023-01-26 ENCOUNTER — Encounter: Payer: Self-pay | Admitting: Pulmonary Disease

## 2023-01-26 ENCOUNTER — Ambulatory Visit: Payer: Medicare PPO | Admitting: Pulmonary Disease

## 2023-01-26 VITALS — BP 90/50 | HR 125 | Ht 62.0 in | Wt 139.6 lb

## 2023-01-26 DIAGNOSIS — R918 Other nonspecific abnormal finding of lung field: Secondary | ICD-10-CM

## 2023-01-26 DIAGNOSIS — R599 Enlarged lymph nodes, unspecified: Secondary | ICD-10-CM | POA: Diagnosis not present

## 2023-01-26 DIAGNOSIS — Z789 Other specified health status: Secondary | ICD-10-CM | POA: Diagnosis not present

## 2023-01-26 LAB — COMPREHENSIVE METABOLIC PANEL
ALT: 10 U/L (ref 0–53)
AST: 20 U/L (ref 0–37)
Albumin: 2.7 g/dL — ABNORMAL LOW (ref 3.5–5.2)
Alkaline Phosphatase: 85 U/L (ref 39–117)
BUN: 13 mg/dL (ref 6–23)
CO2: 28 mEq/L (ref 19–32)
Calcium: 9 mg/dL (ref 8.4–10.5)
Chloride: 101 mEq/L (ref 96–112)
Creatinine, Ser: 0.78 mg/dL (ref 0.40–1.50)
GFR: 81.48 mL/min (ref >60.00)
Glucose, Bld: 148 mg/dL — ABNORMAL HIGH (ref 70–99)
Potassium: 4.2 mEq/L (ref 3.5–5.1)
Sodium: 135 mEq/L (ref 135–145)
Total Bilirubin: 0.9 mg/dL (ref 0.2–1.2)
Total Protein: 7.2 g/dL (ref 6.0–8.3)

## 2023-01-26 LAB — CBC WITH DIFFERENTIAL/PLATELET
Basophils Absolute: 0.1 10*3/uL (ref 0.0–0.1)
Basophils Relative: 0.4 % (ref 0.0–3.0)
Eosinophils Absolute: 0.1 10*3/uL (ref 0.0–0.7)
Eosinophils Relative: 0.4 % (ref 0.0–5.0)
HCT: 32.9 % — ABNORMAL LOW (ref 39.0–52.0)
Hemoglobin: 10.3 g/dL — ABNORMAL LOW (ref 13.0–17.0)
Lymphocytes Relative: 9 % — ABNORMAL LOW (ref 12.0–46.0)
Lymphs Abs: 1.6 10*3/uL (ref 0.7–4.0)
MCHC: 31.4 g/dL (ref 30.0–36.0)
MCV: 91.8 fl (ref 78.0–100.0)
Monocytes Absolute: 1.2 10*3/uL — ABNORMAL HIGH (ref 0.1–1.0)
Monocytes Relative: 6.7 % (ref 3.0–12.0)
Neutro Abs: 14.7 10*3/uL — ABNORMAL HIGH (ref 1.4–7.7)
Neutrophils Relative %: 83.5 % — ABNORMAL HIGH (ref 43.0–77.0)
Platelets: 376 10*3/uL (ref 150.0–400.0)
RBC: 3.59 Mil/uL — ABNORMAL LOW (ref 4.22–5.81)
RDW: 14.9 % (ref 11.5–15.5)
WBC: 17.6 10*3/uL — ABNORMAL HIGH (ref 4.0–10.5)

## 2023-01-26 NOTE — Patient Instructions (Signed)
Thank you for visiting Dr. Valeta Harms at [facility]. Today we recommend the following:  Orders Placed This Encounter  Procedures   Procedural/ Surgical Case Request: ROBOTIC ASSISTED NAVIGATIONAL BRONCHOSCOPY, VIDEO BRONCHOSCOPY WITH ENDOBRONCHIAL ULTRASOUND   QuantiFERON-TB Gold Plus   CBC w/Diff   Comp Met (CMET)   Ambulatory referral to Pulmonology   Bronchoscopy on 02/06/2023  Return in about 18 days (around 02/13/2023) for with Eric Form, NP.    Please do your part to reduce the spread of COVID-19.

## 2023-01-26 NOTE — Addendum Note (Signed)
Addended by: Alvin Critchley on: 01/26/2023 10:48 AM   Modules accepted: Orders

## 2023-01-28 LAB — QUANTIFERON-TB GOLD PLUS
Mitogen-NIL: 2.55 IU/mL
NIL: 0.03 IU/mL
QuantiFERON-TB Gold Plus: NEGATIVE
TB1-NIL: 0.01 IU/mL
TB2-NIL: 0 IU/mL

## 2023-01-30 ENCOUNTER — Telehealth: Payer: Self-pay | Admitting: Internal Medicine

## 2023-01-30 NOTE — Telephone Encounter (Signed)
scheduled per 3/8 referral, pt has been called and confirmed date and time. Pt is aware of location and to arrive early for check in

## 2023-02-02 ENCOUNTER — Other Ambulatory Visit: Payer: Medicare PPO

## 2023-02-02 DIAGNOSIS — R918 Other nonspecific abnormal finding of lung field: Secondary | ICD-10-CM

## 2023-02-04 LAB — SPECIMEN STATUS REPORT

## 2023-02-04 LAB — NOVEL CORONAVIRUS, NAA: SARS-CoV-2, NAA: NOT DETECTED

## 2023-02-05 ENCOUNTER — Telehealth: Payer: Self-pay | Admitting: Pulmonary Disease

## 2023-02-05 ENCOUNTER — Other Ambulatory Visit: Payer: Self-pay

## 2023-02-05 ENCOUNTER — Encounter (HOSPITAL_COMMUNITY): Payer: Self-pay | Admitting: Pulmonary Disease

## 2023-02-05 NOTE — Pre-Procedure Instructions (Signed)
PCP - Dr.Himanshu Paliwal  Cardiologist - pt denies  PPM/ICD - pt denies Device Orders - n/a Rep Notified - n/a  EKG - last month per pt-requested results Stress Test - pt denies ECHO - pt denies Cardiac Cath - pt denies  Sleep Study/CPAP - pt denies  Diabetic- pt denies  Blood Thinner Instructions:pt denies Aspirin Instructions:n/a   NPO after Midnight  COVID TEST- negative on 02/02/23   Anesthesia review: YES, cardiac history. Patient states he has "irregular heart beat" went to see PCP couple weeks ago and was told to take Metoprolol twice a day. Pt denies any current cardiac symptoms.  EKG results and last OV notes requested from patient's PCP Dr.Himanshu Paliwal   Patient verbally denies any shortness of breath, fever, cough and chest pain during phone call.     -------------  SDW INSTRUCTIONS given:   Your procedure is scheduled on 02/06/23.             Report to [facility] Main Entrance "A" at  11:45   A.M., and check in at the Admitting office.             Call this number if you have problems the morning of surgery:             [phone]               Remember:             Do not eat or drink after midnight the night before your surgery                          Take these medicines the morning of surgery with A SIP OF WATER metoprolol tartrate ,dutasteride (AVODART) , tamsulosin (FLOMAX) ,acetaminophen (TYLENOL) -prn  As of today, STOP taking any Aspirin (unless otherwise instructed by your surgeon) Aleve, Naproxen, Ibuprofen, Motrin, Advil, Goody's, BC's, all herbal medications, fish oil, and all vitamins.                        Do not wear jewelry, make up, or nail polish            Do not wear lotions, powders, perfumes/colognes, or deodorant.            Do not shave 48 hours prior to surgery.  Men may shave face and neck.            Do not bring valuables to the hospital.            [facility] is not responsible for any belongings or valuables.   Do  NOT Smoke (Tobacco/Vaping) 24 hours prior to your procedure If you use a CPAP at night, you may bring all equipment for your overnight stay.   Contacts, glasses, dentures or partials may not be worn into surgery.      For patients admitted to the hospital, discharge time will be determined by your treatment team.   Patients discharged the day of surgery will not be allowed to drive home, and someone needs to stay with them for 24 hours.     Special instructions:   [facility]- Preparing For Surgery  Oral Hygiene is also important to reduce your risk of infection.  Remember - BRUSH YOUR TEETH THE MORNING OF SURGERY WITH YOUR REGULAR TOOTHPASTE   Before surgery, you can play an important role. Because skin is not sterile, your skin needs to be  as free of germs as possible. You can reduce the number of germs on your skin by washing with Dial (or any antibacterial) Soap before surgery.    Please do not use if you have an allergy to antibacterial soaps. If your skin becomes reddened/irritated stop using the Antibacterial Soap  Do not shave (including legs and underarms) for at least 48 hours prior to surgery. It is OK to shave your face.   Please follow these instructions carefully.              Shower the NIGHT BEFORE SURGERY and the MORNING OF SURGERY with (DIAL) Antibacterial Soap. Wash your body and hair with your normal shampoo/soap then rinse. Using a clean wash cloth wash from your neck down using the antibacterial soap. Wash thoroughly, paying special attention to the area where your surgery will be performed. Thoroughly rinse your body with warm water from the neck down.   Pat yourself dry with a CLEAN TOWEL.   Wear CLEAN PAJAMAS to bed the night before surgery.   Place CLEAN SHEETS on your bed the night of your surgery and DO NOT SLEEP WITH PETS.     Day of Surgery: Please shower morning of surgery using antibacterial soap Wear Clean/Comfortable clothing the morning of  surgery Do not apply any deodorants/lotions.   Remember to brush your teeth WITH YOUR REGULAR TOOTHPASTE.   Questions were answered. Patient verbalized understanding of instructions.

## 2023-02-05 NOTE — Telephone Encounter (Signed)
Hospital calling regarding this PT.  Need orders placed in Epic for this PT surgery tomorrow. Adv I will let nurse know as well.

## 2023-02-05 NOTE — Telephone Encounter (Signed)
No phone number for [place] to call about this to check to see if this was taken care of.  Routing to Dr. Valeta Harms to see if he knows.

## 2023-02-06 ENCOUNTER — Encounter (HOSPITAL_COMMUNITY): Payer: Self-pay | Admitting: Pulmonary Disease

## 2023-02-06 ENCOUNTER — Encounter (HOSPITAL_COMMUNITY)
Admission: RE | Disposition: A | Discharge status: Home / Self Care | Payer: Self-pay | Source: Home / Self Care | Attending: Pulmonary Disease

## 2023-02-06 ENCOUNTER — Other Ambulatory Visit: Payer: Self-pay

## 2023-02-06 ENCOUNTER — Ambulatory Visit (HOSPITAL_COMMUNITY): Payer: Medicare PPO | Admitting: Physician Assistant

## 2023-02-06 ENCOUNTER — Ambulatory Visit (HOSPITAL_BASED_OUTPATIENT_CLINIC_OR_DEPARTMENT_OTHER): Payer: Medicare PPO | Admitting: Physician Assistant

## 2023-02-06 ENCOUNTER — Ambulatory Visit (HOSPITAL_COMMUNITY)
Admission: RE | Admit: 2023-02-06 | Discharge: 2023-02-06 | Disposition: A | Discharge status: Home / Self Care | Payer: Medicare PPO | Attending: Pulmonary Disease | Admitting: Pulmonary Disease

## 2023-02-06 DIAGNOSIS — J984 Other disorders of lung: Secondary | ICD-10-CM | POA: Insufficient documentation

## 2023-02-06 DIAGNOSIS — R918 Other nonspecific abnormal finding of lung field: Secondary | ICD-10-CM

## 2023-02-06 DIAGNOSIS — R911 Solitary pulmonary nodule: Secondary | ICD-10-CM

## 2023-02-06 DIAGNOSIS — K089 Disorder of teeth and supporting structures, unspecified: Secondary | ICD-10-CM | POA: Diagnosis not present

## 2023-02-06 DIAGNOSIS — J45909 Unspecified asthma, uncomplicated: Secondary | ICD-10-CM | POA: Diagnosis not present

## 2023-02-06 DIAGNOSIS — Z79899 Other long term (current) drug therapy: Secondary | ICD-10-CM | POA: Diagnosis not present

## 2023-02-06 DIAGNOSIS — I10 Essential (primary) hypertension: Secondary | ICD-10-CM

## 2023-02-06 HISTORY — PX: VIDEO BRONCHOSCOPY WITH ENDOBRONCHIAL ULTRASOUND: SHX6177

## 2023-02-06 HISTORY — PX: FINE NEEDLE ASPIRATION: SHX5430

## 2023-02-06 HISTORY — DX: Essential (primary) hypertension: I10

## 2023-02-06 HISTORY — PX: BRONCHIAL WASHINGS: SHX5105

## 2023-02-06 SURGERY — BRONCHOSCOPY, WITH EBUS
Anesthesia: General | Laterality: Bilateral

## 2023-02-06 MED ORDER — DEXAMETHASONE SODIUM PHOSPHATE 10 MG/ML IJ SOLN
INTRAMUSCULAR | Status: DC | PRN
  Administered 2023-02-06: 5 mg via INTRAVENOUS

## 2023-02-06 MED ORDER — CHLORHEXIDINE GLUCONATE 0.12 % MT SOLN
15.0000 mL | Freq: Once | OROMUCOSAL | Status: AC
  Administered 2023-02-06: 15 mL via OROMUCOSAL
  Filled 2023-02-06: qty 15

## 2023-02-06 MED ORDER — LACTATED RINGERS IV SOLN: INTRAVENOUS | Status: DC | PRN

## 2023-02-06 MED ORDER — ONDANSETRON HCL 4 MG/2ML IJ SOLN
INTRAMUSCULAR | Status: DC | PRN
  Administered 2023-02-06: 4 mg via INTRAVENOUS

## 2023-02-06 MED ORDER — SUGAMMADEX SODIUM 200 MG/2ML IV SOLN
INTRAVENOUS | Status: DC | PRN
  Administered 2023-02-06: 200 mg via INTRAVENOUS

## 2023-02-06 MED ORDER — ROCURONIUM BROMIDE 10 MG/ML (PF) SYRINGE
PREFILLED_SYRINGE | INTRAVENOUS | Status: DC | PRN
  Administered 2023-02-06: 60 mg via INTRAVENOUS

## 2023-02-06 MED ORDER — PROPOFOL 10 MG/ML IV BOLUS
INTRAVENOUS | Status: DC | PRN
  Administered 2023-02-06: 120 mg via INTRAVENOUS

## 2023-02-06 MED ORDER — PHENYLEPHRINE 80 MCG/ML (10ML) SYRINGE FOR IV PUSH (FOR BLOOD PRESSURE SUPPORT)
PREFILLED_SYRINGE | INTRAVENOUS | Status: DC | PRN
  Administered 2023-02-06: 80 ug via INTRAVENOUS
  Administered 2023-02-06: 160 ug via INTRAVENOUS

## 2023-02-06 MED ORDER — LIDOCAINE 2% (20 MG/ML) 5 ML SYRINGE
INTRAMUSCULAR | Status: DC | PRN
  Administered 2023-02-06: 40 mg via INTRAVENOUS

## 2023-02-06 MED ORDER — AMOXICILLIN-POT CLAVULANATE 875-125 MG PO TABS: 1.0000 | ORAL_TABLET | Freq: Two times a day (BID) | ORAL | 0 refills | Status: AC

## 2023-02-06 MED ORDER — LACTATED RINGERS IV SOLN: INTRAVENOUS | Status: DC

## 2023-02-06 SURGICAL SUPPLY — 29 items
BRUSH CYTOL CELLEBRITY 1.5X140 (MISCELLANEOUS) IMPLANT
CANISTER SUCT 3000ML PPV (MISCELLANEOUS) ×3 IMPLANT
CONT SPEC 4OZ CLIKSEAL STRL BL (MISCELLANEOUS) ×3 IMPLANT
COVER BACK TABLE 60X90IN (DRAPES) ×3 IMPLANT
COVER DOME SNAP 22 D (MISCELLANEOUS) ×3 IMPLANT
FORCEPS BIOP RJ4 1.8 (CUTTING FORCEPS) IMPLANT
GAUZE SPONGE 4X4 12PLY STRL (GAUZE/BANDAGES/DRESSINGS) ×3 IMPLANT
GLOVE BIO SURGEON STRL SZ7.5 (GLOVE) ×3 IMPLANT
GOWN STRL REUS W/ TWL LRG LVL3 (GOWN DISPOSABLE) ×3 IMPLANT
GOWN STRL REUS W/TWL LRG LVL3 (GOWN DISPOSABLE) ×3
KIT CLEAN ENDO COMPLIANCE (KITS) ×6 IMPLANT
KIT TURNOVER KIT B (KITS) ×3 IMPLANT
MARKER SKIN DUAL TIP RULER LAB (MISCELLANEOUS) ×3 IMPLANT
NDL EBUS SONO TIP PENTAX (NEEDLE) ×2 IMPLANT
NEEDLE EBUS SONO TIP PENTAX (NEEDLE) ×3 IMPLANT
NS IRRIG 1000ML POUR BTL (IV SOLUTION) ×3 IMPLANT
OIL SILICONE PENTAX (PARTS (SERVICE/REPAIRS)) ×3 IMPLANT
PAD ARMBOARD 7.5X6 YLW CONV (MISCELLANEOUS) ×6 IMPLANT
SOL ANTI FOG 6CC (MISCELLANEOUS) ×3 IMPLANT
SYR 20CC LL (SYRINGE) ×6 IMPLANT
SYR 20ML ECCENTRIC (SYRINGE) ×6 IMPLANT
SYR 50ML SLIP (SYRINGE) IMPLANT
SYR 5ML LUER SLIP (SYRINGE) ×3 IMPLANT
TOWEL OR 17X24 6PK STRL BLUE (TOWEL DISPOSABLE) ×3 IMPLANT
TRAP SPECIMEN MUCOUS 40CC (MISCELLANEOUS) IMPLANT
TUBE CONNECTING 20X1/4 (TUBING) ×6 IMPLANT
UNDERPAD 30X30 (UNDERPADS AND DIAPERS) ×3 IMPLANT
VALVE DISPOSABLE (MISCELLANEOUS) ×3 IMPLANT
WATER STERILE IRR 1000ML POUR (IV SOLUTION) ×3 IMPLANT

## 2023-02-06 NOTE — Op Note (Signed)
Video Bronchoscopy with Endobronchial Ultrasound Procedure Note  Date of Operation: 02/06/2023  Pre-op Diagnosis: Right sided large cavitary lesions  Post-op Diagnosis: Right sided lower lobe cavitary lesion  Surgeon: Garner Nash, DO   Assistants: None   Anesthesia: General endotracheal anesthesia  Operation: Flexible video fiberoptic bronchoscopy with endobronchial ultrasound and biopsies.  Estimated Blood Loss: Minimal  Complications: None   Indications and History: Bruce Valdez is a 88 y.o. male with right-sided lower lung cavitary lesions.  The risks, benefits, complications, treatment options and expected outcomes were discussed with the patient.  The possibilities of pneumothorax, pneumonia, reaction to medication, pulmonary aspiration, perforation of a viscus, bleeding, failure to diagnose a condition and creating a complication requiring transfusion or operation were discussed with the patient who freely signed the consent.    Description of Procedure: The patient was examined in the preoperative area and history and data from the preprocedure consultation were reviewed. It was deemed appropriate to proceed.  The patient was taken to [facility] endoscopy room 3, identified as [name] and the procedure verified as Flexible Video Fiberoptic Bronchoscopy.  A Time Out was held and the above information confirmed. After being taken to the operating room general anesthesia was initiated and the patient  was orally intubated. The video fiberoptic bronchoscope was introduced via the endotracheal tube and a general inspection was performed which showed normal right and left lung anatomy except for visible yellow creamy pus coming from the right lower lobe.  Prior to using the EBUS scope we use a standard therapeutic bronchoscope for aspiration of the bilateral mainstem's removal of the significant amount of pus and secretions coming from the right lower lobe which were aspirated and  collected into a specimen container.  We completed a BAL for right sided lower lobe bronchial washings.  The standard scope was then withdrawn and the endobronchial ultrasound was used to identify and characterize the peritracheal, hilar and bronchial lymph nodes. Inspection showed enlarged wall of the cavitary lesion in the right lower lobe. Using real-time ultrasound guidance Wang needle biopsies were take from the lobe consolidation/mass and were sent for cytology.  After the EBUS portion of the procedure we switched to the standard therapeutic scope.  This was used for aspiration for removal of any remaining blood clots and debris from the airways.  We then completed a BAL with moderate saline return in the right lower lobe to be sent for cultures.  The patient tolerated the procedure well without apparent complications. There was no significant blood loss. The bronchoscope was withdrawn. Anesthesia was reversed and the patient was taken to the PACU for recovery.   Samples: 1.  Ultrasound-guided transbronchial needle aspirations of right lower lobe mass  Plans:  The patient will be discharged from the PACU to home when recovered from anesthesia. We will review the cytology, pathology and microbiology results with the patient when they become available. Outpatient followup will be with Garner Nash, DO.   Garner Nash, DO [facility] 02/06/2023 2:53 PM

## 2023-02-06 NOTE — Anesthesia Postprocedure Evaluation (Signed)
Anesthesia Post Note  Patient: Bruce Valdez  Procedure(s) Performed: VIDEO BRONCHOSCOPY WITH ENDOBRONCHIAL ULTRASOUND (Bilateral) FINE NEEDLE ASPIRATION (FNA) LINEAR BRONCHIAL WASHINGS     Patient location during evaluation: PACU Anesthesia Type: General Level of consciousness: awake and alert Pain management: pain level controlled Vital Signs Assessment: post-procedure vital signs reviewed and stable Respiratory status: spontaneous breathing, nonlabored ventilation and respiratory function stable Cardiovascular status: blood pressure returned to baseline and stable Postop Assessment: no apparent nausea or vomiting Anesthetic complications: no   No notable events documented.  Last Vitals:  Vitals:   02/06/23 1525 02/06/23 1530  BP:  (!) 106/58  Pulse: 70 71  Resp: (!) 25 (!) 23  Temp:    SpO2: 96% 97%    Last Pain:  Vitals:   02/06/23 1520  TempSrc: Oral  PainSc:                  Lynda Rainwater

## 2023-02-06 NOTE — Progress Notes (Signed)
Reviewed prior to bronchoscopy   Thanks,  BLI  Garner Nash, DO [facility] 02/06/2023 2:05 PM

## 2023-02-06 NOTE — Anesthesia Preprocedure Evaluation (Addendum)
Anesthesia Evaluation  Patient identified by MRN, date of birth, ID band Patient awake    Reviewed: Allergy & Precautions, H&P , NPO status , Patient's Chart, lab work & pertinent test results  Airway Mallampati: II  TM Distance: >3 FB Neck ROM: Full    Dental  (+) Poor Dentition   Pulmonary asthma    Pulmonary exam normal breath sounds clear to auscultation       Cardiovascular hypertension, Pt. on medications Normal cardiovascular exam Rhythm:Regular Rate:Normal     Neuro/Psych negative neurological ROS  negative psych ROS   GI/Hepatic negative GI ROS, Neg liver ROS,,,  Endo/Other  negative endocrine ROS    Renal/GU negative Renal ROS  negative genitourinary   Musculoskeletal negative musculoskeletal ROS (+)    Abdominal   Peds negative pediatric ROS (+)  Hematology negative hematology ROS (+)   Anesthesia Other Findings Lung Mass  Reproductive/Obstetrics negative OB ROS                             Anesthesia Physical Anesthesia Plan  ASA: 3  Anesthesia Plan: General   Post-op Pain Management: Minimal or no pain anticipated   Induction: Intravenous  PONV Risk Score and Plan: 2 and Ondansetron, Midazolam and Treatment may vary due to age or medical condition  Airway Management Planned: Oral ETT  Additional Equipment:   Intra-op Plan:   Post-operative Plan: Extubation in OR  Informed Consent: I have reviewed the patients History and Physical, chart, labs and discussed the procedure including the risks, benefits and alternatives for the proposed anesthesia with the patient or authorized representative who has indicated his/her understanding and acceptance.     Dental advisory given  Plan Discussed with: CRNA  Anesthesia Plan Comments:        Anesthesia Quick Evaluation

## 2023-02-06 NOTE — Transfer of Care (Signed)
Immediate Anesthesia Transfer of Care Note  Patient: Bruce Valdez  Procedure(s) Performed: VIDEO BRONCHOSCOPY WITH ENDOBRONCHIAL ULTRASOUND (Bilateral) FINE NEEDLE ASPIRATION (FNA) LINEAR BRONCHIAL WASHINGS  Patient Location: PACU  Anesthesia Type:General  Level of Consciousness: awake, drowsy, and patient cooperative  Airway & Oxygen Therapy: Patient Spontanous Breathing and Patient connected to nasal cannula oxygen  Post-op Assessment: Report given to RN and Post -op Vital signs reviewed and stable  Post vital signs: Reviewed and stable  Last Vitals:  Vitals Value Taken Time  BP 104/62 02/06/23 1457  Temp    Pulse 72 02/06/23 1459  Resp 17 02/06/23 1459  SpO2 95 % 02/06/23 1459  Vitals shown include unvalidated device data.  Last Pain:  Vitals:   02/06/23 1156  TempSrc: Oral  PainSc: 0-No pain         Complications: No notable events documented.

## 2023-02-06 NOTE — Anesthesia Procedure Notes (Signed)
Procedure Name: Intubation Date/Time: 02/06/2023 2:12 PM  Performed by: Inda Coke, CRNAPre-anesthesia Checklist: Patient identified, Emergency Drugs available, Suction available and Patient being monitored Patient Re-evaluated:Patient Re-evaluated prior to induction Oxygen Delivery Method: Circle System Utilized Preoxygenation: Pre-oxygenation with 100% oxygen Induction Type: IV induction Ventilation: Mask ventilation without difficulty Laryngoscope Size: Mac and 4 Grade View: Grade I Tube type: Oral Tube size: 8.5 mm Number of attempts: 1 Airway Equipment and Method: Stylet and Oral airway Placement Confirmation: ETT inserted through vocal cords under direct vision, positive ETCO2 and breath sounds checked- equal and bilateral Secured at: 23 cm Tube secured with: Tape Dental Injury: Teeth and Oropharynx as per pre-operative assessment

## 2023-02-06 NOTE — Progress Notes (Signed)
History of Present Illness Bruce Valdez is a 88 y.o. male never smoker with RLL cavitary lesions. Referred in March 2024 to see Dr. Valeta Harms for lung mass by Loura Pardon, MD   Synopsis This is an 88 year old gentleman Referred after having CT imaging of the chest completed on 01/18/2023.  Patient was found to have a large conglomerate of masses within the right chest measuring largest cross-section at 9.7 cm.  Additionally there was interlobular septal thickening concerning for lymphangitic spread and subcarinal adenopathy.  Patient has ongoing cough.  That is what led to his evaluation.  He otherwise has never been hospitalized in his life had intermittent asthma symptoms many years ago.  He takes no medications.  He is a lifelong non-smoker.  Has no family history of lung cancer and he came from [place] to the [place] after World War II. He underwent a Flexible video fiberoptic bronchoscopy with endobronchial ultrasound and biopsies on 02/06/2023. The procedure revealed infection. He was started on Augmentin 02/06/2023 with plan for 28 days of treatment. He is here today for follow up.     02/13/2023 Pt. Presents for follow up after Flexible video fiberoptic bronchoscopy with endobronchial ultrasound and biopsies on 02/06/2023. He states he has been doing well. He had no bleeding, no sore throat, no fever. His cough has almost disappeared since the bronchoscopy. He has been taking the Augmentin Dr.Icard prescribed since 3/19 for the puss and infection seen on the bronch. Medication will be complete 03/06/2023. Biopsy was negative for malignant cells. Dr. Valeta Harms is concerned that the results may not be conclusive as they may have been altered by the infection. We will repeat CT Chest with contrast  in 4 weeks and re-evaluate the size of the nodule after treatment with antibiotics.. If it has shrunk, we may continue to watch it, but if it is the same size, will need to repeat biopsy.  Pt.and family  are in agreement with this plan.    Test Results: FINAL MICROSCOPIC DIAGNOSIS:   B. LUNG, RLL MASS, FINE NEEDLE ASPIRATION:  - No malignant cells identified  - See comment  COMMENT:  Prominent acute inflammation is present - differential diagnosis can  include an abscess.      Latest Ref Rng & Units 01/26/2023   10:38 AM  CBC  WBC 4.0 - 10.5 K/uL 17.6 Repeated and verified X2.   Hemoglobin 13.0 - 17.0 g/dL 10.3   Hematocrit 39.0 - 52.0 % 32.9   Platelets 150.0 - 400.0 K/uL 376.0        Latest Ref Rng & Units 01/26/2023   10:38 AM  BMP  Glucose 70 - 99 mg/dL 148   BUN 6 - 23 mg/dL 13   Creatinine 0.40 - 1.50 mg/dL 0.78   Sodium 135 - 145 mEq/L 135   Potassium 3.5 - 5.1 mEq/L 4.2   Chloride 96 - 112 mEq/L 101   CO2 19 - 32 mEq/L 28   Calcium 8.4 - 10.5 mg/dL 9.0     BNP No results found for: "BNP"  ProBNP No results found for: "PROBNP"  PFT No results found for: "FEV1PRE", "FEV1POST", "FVCPRE", "FVCPOST", "TLC", "DLCOUNC", "PREFEV1FVCRT", "PSTFEV1FVCRT"  NM PET Image Initial (PI) Skull Base To Thigh (F-18 FDG)  Result Date: 02/12/2023 CLINICAL DATA:  Initial treatment strategy for lung mass. EXAM: NUCLEAR MEDICINE PET SKULL BASE TO THIGH TECHNIQUE: 6.9 mCi F-18 FDG was injected intravenously. Full-ring PET imaging was performed from the skull base to thigh after the  radiotracer. CT data was obtained and used for attenuation correction and anatomic localization. Fasting blood glucose: 121 mg/dl COMPARISON:  01/18/2023 FINDINGS: Mediastinal blood pool activity: SUV max 2.36 Liver activity: SUV max NA NECK: No hypermetabolic lymph nodes in the neck. Incidental CT findings: None. CHEST: Centrally necrotic mass, with internal cavitation and the posterolateral right upper lobe is again noted measuring 4.3 x 4.6 cm with SUV max of 9.71, image 35/7. On the CT from 01/18/2023 this measured 4.9 x 3.4 cm. Adjacent mass involving the superior segment of the right lower lobe measures 8.0  x 5.2 cm and has an SUV max of 10.04, image 78/4. These 2 masses converge forming a conglomerate lesions spanning the major and minor fissures as noted on the previous CT. New small right pleural effusion. -Tracer avid, right prevascular mediastinal lymph nodes measures 7 mm and has an SUV max of 4.01, image 53/4. Low right paratracheal lymph node measures 0.9 cm and has an SUV max 4.30, image 62/4 Subcarinal lymph node measures 1.3 cm and has an SUV max of 4.24, image 68/4. Right hilar lymph node has an SUV max of 4.0, image 68 of the fused PET-CT images. Incidental CT findings: Aortic atherosclerosis and coronary artery calcifications. No pericardial effusion. ABDOMEN/PELVIS: No abnormal tracer uptake identified within the liver, pancreas, spleen, or adrenal glands. No tracer avid abdominal or pelvic lymph nodes. Incidental CT findings: Aortic atherosclerosis. Unchanged 2.3 cm Bosniak class 1 cyst off the upper pole of right kidney. No follow-up imaging recommended. Midline supraumbilical abdominal wall hernia measures 2.2 cm and contains fat and a small volume of fluid prostate gland enlargement. SKELETON: No focal hypermetabolic activity to suggest skeletal metastasis. Incidental CT findings: Increased radiotracer uptake localizing to the anterior aspect of the right sixth and seventh ribs are identified. On the corresponding CT images there are nondisplaced fractures which appear new from previous imaging. IMPRESSION: 1. There is FDG uptake associated with the right upper lobe and superior segment of right lower lobe pulmonary masses. As noted on the previous CT from 01/18/2023 these masses converge forming a conglomerate lesion spanning the major and minor fissures. 2. FDG avid right hilar, right paratracheal, subcarinal and prevascular lymph nodes are identified compatible with metastatic adenopathy. 3. New small right pleural effusion. Consider diagnostic thoracentesis to assess for malignant effusion. 4. No  signs of FDG avid metastatic disease to the abdomen or pelvis. 5. Increased radiotracer uptake localizing to the anterior aspect of the right sixth and seventh ribs. On the corresponding CT images there are nondisplaced fractures which appear new from previous imaging. Correlate for any clinical history of recent trauma to this area. 6. Coronary artery calcifications noted. 7. Midline supraumbilical abdominal wall hernia contains fat and a small volume of fluid. 8.  Aortic Atherosclerosis (ICD10-I70.0). Electronically Signed   By: Kerby Moors M.D.   On: 02/12/2023 13:42   CT CHEST W CONTRAST  Result Date: 01/20/2023 CLINICAL DATA:  Follow-up abnormal chest CT. Cough for 2 weeks. * Tracking Code: BO *. EXAM: CT CHEST WITH CONTRAST TECHNIQUE: Multidetector CT imaging of the chest was performed during intravenous contrast administration. RADIATION DOSE REDUCTION: This exam was performed according to the departmental dose-optimization program which includes automated exposure control, adjustment of the mA and/or kV according to patient size and/or use of iterative reconstruction technique. CONTRAST:  103mL ISOVUE-300 IOPAMIDOL (ISOVUE-300) INJECTION 61% COMPARISON:  01/06/2023 FINDINGS: Cardiovascular: Heart size is normal. Aortic atherosclerosis. Coronary artery calcifications. No pericardial effusion. Mediastinum/Nodes: Thyroid gland, trachea, and  esophagus are unremarkable. Subcarinal lymph node measures 1.6 cm, image 68/2. Formally 1.4 cm (when remeasured). No axillary or supraclavicular adenopathy. Lungs/Pleura: The subpleural mass within the posterolateral right upper lobe is again noted. On today's exam this measures 4.9 x 3.4 cm, image 72/3. Formally 3.4 x 3.2 cm. Signs of central necrosis within this mass are identified, image 74/2. As before this mass extends across the major and minor fissures. The second mass within the superior segment of the right lower lobe measures 7.3 by 7.8 cm with areas of  internal necrosis, image 85/2. On the previous exam this measured 5.5 x 4.5 cm. These 2 masses now converge resulting in a conglomerate mass measuring 9.7 x 5.6 x 6.0 cm. New mild interlobular septal thickening is noted surrounding the conglomerate mass which may reflect lymphangitic spread of disease. No suspicious nodule or mass identified within the left lung. Upper Abdomen: No acute findings. There is a Bosniak class 1 cyst arising off the posterior cortex of the upper pole of right kidney measuring 2 x 1.8 cm. No follow-up imaging recommended. Too small to characterize low-density structure within left hepatic lobe measures 4 mm. Musculoskeletal: No chest wall abnormality. No acute or significant osseous findings. IMPRESSION: 1. Interval increase in size of subpleural mass(s) within the posterolateral right upper lobe and superior segment of the right lower lobe. These masses now converge resulting in a conglomerate mass measuring 9.7 x 5.6 x 6.0 cm. Findings are worrisome for progressive primary bronchogenic carcinoma. Recommend referral to multi disciplinary thoracic oncology clinic. Additionally further evaluation with PET-CT should be considered in the near future for staging purposes. 2. New mild interlobular septal thickening surrounding the now conglomerate right lung mass which may reflect lymphangitic spread of disease. 3. Interval increase in size of enlarged subcarinal lymph node. 4. Coronary artery calcifications. 5.  Aortic Atherosclerosis (ICD10-I70.0). Electronically Signed   By: Kerby Moors M.D.   On: 01/20/2023 17:32     Past medical hx Past Medical History:  Diagnosis Date   Asthma    Hypertension    Nonsmoker      Social History   Tobacco Use   Smoking status: Never    Passive exposure: Never   Smokeless tobacco: Never  Vaping Use   Vaping Use: Never used  Substance Use Topics   Alcohol use: Not Currently   Drug use: Not Currently    Mr.Apps reports that he has never  smoked. He has never been exposed to tobacco smoke. He has never used smokeless tobacco. He reports that he does not currently use alcohol. He reports that he does not currently use drugs.  Tobacco Cessation: Never smoker   Past surgical hx, Family hx, Social hx all reviewed.  Current Outpatient Medications on File Prior to Visit  Medication Sig   acetaminophen (TYLENOL) 500 MG tablet Take 1,000 mg by mouth every 6 (six) hours as needed for mild pain or moderate pain.   amoxicillin-clavulanate (AUGMENTIN) 875-125 MG tablet Take 1 tablet by mouth 2 (two) times daily for 28 days.   dutasteride (AVODART) 0.5 MG capsule Take 0.5 mg by mouth daily.   Glucosamine-Chondroit-Vit C-Mn (GLUCOSAMINE CHONDR 1500 COMPLX PO) Take 1,500 mg by mouth daily.   metoprolol tartrate (LOPRESSOR) 50 MG tablet Take 50 mg by mouth 2 (two) times daily.   Multiple Vitamins-Minerals (MULTIVITAMIN WITH MINERALS) tablet Take 1 tablet by mouth daily. 50+   promethazine-dextromethorphan (PROMETHAZINE-DM) 6.25-15 MG/5ML syrup Take 10 mLs by mouth every 8 (eight) hours as needed  for cough.   tamsulosin (FLOMAX) 0.4 MG CAPS capsule Take 0.4 mg by mouth daily.   No current facility-administered medications on file prior to visit.     No Known Allergies  Review Of Systems:  Constitutional:   No  weight loss, night sweats,  Fevers, chills, fatigue, or  lassitude.  HEENT:   No headaches,  Difficulty swallowing,  Tooth/dental problems, or  Sore throat,                No sneezing, itching, ear ache, nasal congestion, post nasal drip,   CV:  No chest pain,  Orthopnea, PND, swelling in lower extremities, anasarca, dizziness, palpitations, syncope.   GI  No heartburn, indigestion, abdominal pain, nausea, vomiting, diarrhea, change in bowel habits, loss of appetite, bloody stools.   Resp: No shortness of breath with exertion or at rest.  No excess mucus, no productive cough,  No non-productive cough,  No coughing up of blood.   No change in color of mucus.  No wheezing.  No chest wall deformity  Skin: no rash or lesions.  GU: no dysuria, change in color of urine, no urgency or frequency.  No flank pain, no hematuria   MS:  No joint pain or swelling.  No decreased range of motion.  No back pain.  Psych:  No change in mood or affect. No depression or anxiety.  No memory loss.   Vital Signs BP 118/66   Pulse 63   Temp 97.8 F (36.6 C) (Oral)   Ht 5\' 2"  (1.575 m)   Wt 144 lb 3.2 oz (65.4 kg)   SpO2 97%   BMI 26.37 kg/m    Physical Exam:  General- No distress,  A&Ox3, pleasant ENT: No sinus tenderness, TM clear, pale nasal mucosa, no oral exudate,no post nasal drip, no LAN Cardiac: S1, S2, regular rate and rhythm, no murmur Chest: No wheeze/ rales/ dullness; no accessory muscle use, no nasal flaring, no sternal retractions Abd.: Soft Non-tender, ND, BS +, Body mass index is 26.37 kg/m.  Ext: No clubbing cyanosis, edema Neuro:  normal strength, MAE x 4, A&O x 3 Skin: No rashes, warm and dry, No lesions  Psych: normal mood and behavior   Assessment/Plan RLL cavitary lesion with biopsy negative for malignant Cells in lifelong non-smoker Infection found on bronchoscopy 3/19 Treatment with Augmentin x 28 days started 3/19 Plan I am glad you have done well after the procedure. Finish Augmentin until gone  Follow up CT Chest in 4 weeks to re-evaluate after antibiotic therapy. You will get a call to schedule.   If the nodule of concern is the same size, we will most likely need to repeat the procedure and biopsies.  Please contact office for sooner follow up if symptoms do not improve or worsen or seek emergency care   I spent 35 minutes dedicated to the care of this patient on the date of this encounter to include pre-visit review of records, face-to-face time with the patient discussing conditions above, post visit ordering of testing, clinical documentation with the electronic health record, making  appropriate referrals as documented, and communicating necessary information to the patient's healthcare team.    Magdalen Spatz, NP 02/13/2023  5:16 PM

## 2023-02-06 NOTE — Discharge Instructions (Signed)

## 2023-02-06 NOTE — Interval H&P Note (Signed)
History and Physical Interval Note:  02/06/2023 12:32 PM  Bruce Valdez  has presented today for surgery, with the diagnosis of lung mass.  The various methods of treatment have been discussed with the patient and family. After consideration of risks, benefits and other options for treatment, the patient has consented to  Procedure(s): ROBOTIC ASSISTED NAVIGATIONAL BRONCHOSCOPY (Right) VIDEO BRONCHOSCOPY WITH ENDOBRONCHIAL ULTRASOUND (Bilateral) as a surgical intervention.  The patient's history has been reviewed, patient examined, no change in status, stable for surgery.  I have reviewed the patient's chart and labs.  Questions were answered to the patient's satisfaction.     [place]

## 2023-02-07 ENCOUNTER — Encounter (HOSPITAL_COMMUNITY): Payer: Self-pay | Admitting: Pulmonary Disease

## 2023-02-07 ENCOUNTER — Other Ambulatory Visit: Payer: Medicare PPO

## 2023-02-08 LAB — CULTURE, BAL-QUANTITATIVE W GRAM STAIN
Culture: 60000 — AB
Culture: 40000 — AB

## 2023-02-08 LAB — CYTOLOGY - NON PAP

## 2023-02-08 NOTE — Progress Notes (Signed)
I do not think that this lesions in the chest are cancer.  He is a lifelong non-smoker.  And there was a significant amount of pus draining from the lung. I started him on Augmentin at discharge from the hospital.  Thanks,  [place], [facility] 02/08/2023 4:56 PM

## 2023-02-09 LAB — ACID FAST SMEAR (AFB, MYCOBACTERIA)
Acid Fast Smear: NEGATIVE
Acid Fast Smear: NEGATIVE

## 2023-02-11 LAB — ANAEROBIC CULTURE W GRAM STAIN

## 2023-02-12 ENCOUNTER — Encounter (HOSPITAL_COMMUNITY)
Admission: RE | Admit: 2023-02-12 | Discharge: 2023-02-12 | Disposition: A | Discharge status: Home / Self Care | Payer: Medicare PPO | Source: Ambulatory Visit | Attending: Pulmonary Disease | Admitting: Pulmonary Disease

## 2023-02-12 DIAGNOSIS — R918 Other nonspecific abnormal finding of lung field: Secondary | ICD-10-CM | POA: Insufficient documentation

## 2023-02-12 LAB — GLUCOSE, CAPILLARY: Glucose-Capillary: 121 mg/dL — ABNORMAL HIGH (ref 70–99)

## 2023-02-12 MED ORDER — FLUDEOXYGLUCOSE F - 18 (FDG) INJECTION
9.0000 | Freq: Once | INTRAVENOUS | Status: AC | PRN
  Administered 2023-02-12: 6.9 via INTRAVENOUS

## 2023-02-12 NOTE — Progress Notes (Signed)
Bruce Valdez, I am not sure what to think about this case. I think his bx was negative because all of the infection/pus present.... I think he might have cancer and infection??? So he needs a repeat ct in ~3 weeks. Once he has completed Abx. Then we need to get him setup for a repeat bronch. I didn't navigate to the large cavitary lesion.... the hilar stuff I bx under [place] was no hypermetabolic on the PET.   Thanks,  BL[facility]  Garner Nash, DO [facility] 02/12/2023 5:10 PM

## 2023-02-13 ENCOUNTER — Encounter: Payer: Self-pay | Admitting: Acute Care

## 2023-02-13 ENCOUNTER — Ambulatory Visit: Payer: Medicare PPO | Admitting: Acute Care

## 2023-02-13 VITALS — BP 118/66 | HR 63 | Temp 97.8°F | Ht 62.0 in | Wt 144.2 lb

## 2023-02-13 DIAGNOSIS — R911 Solitary pulmonary nodule: Secondary | ICD-10-CM | POA: Diagnosis not present

## 2023-02-13 NOTE — Patient Instructions (Addendum)
It is good to see you today. I am glad you have done well after the procedure. Finish Augmentin until gone  Follow up CT Chest in 4 weeks to re-evaluate after antibiotic therapy. You will get a call to schedule.   If the nodule of concern is the same size, we will most likely need to repeat the procedure and biopsies.  Please contact office for sooner follow up if symptoms do not improve or worsen or seek emergency care

## 2023-02-14 ENCOUNTER — Other Ambulatory Visit: Payer: Medicare PPO

## 2023-02-14 ENCOUNTER — Ambulatory Visit: Payer: Medicare PPO | Admitting: Internal Medicine

## 2023-03-12 ENCOUNTER — Ambulatory Visit (HOSPITAL_BASED_OUTPATIENT_CLINIC_OR_DEPARTMENT_OTHER)
Admission: RE | Admit: 2023-03-12 | Discharge: 2023-03-12 | Disposition: A | Discharge status: Home / Self Care | Payer: Medicare PPO | Source: Ambulatory Visit | Attending: Acute Care | Admitting: Acute Care

## 2023-03-12 ENCOUNTER — Encounter (HOSPITAL_BASED_OUTPATIENT_CLINIC_OR_DEPARTMENT_OTHER): Payer: Self-pay

## 2023-03-12 DIAGNOSIS — R911 Solitary pulmonary nodule: Secondary | ICD-10-CM | POA: Diagnosis not present

## 2023-03-12 MED ORDER — IOHEXOL 300 MG/ML  SOLN
100.0000 mL | Freq: Once | INTRAMUSCULAR | Status: AC | PRN
  Administered 2023-03-12: 80 mL via INTRAVENOUS

## 2023-03-14 ENCOUNTER — Ambulatory Visit (HOSPITAL_BASED_OUTPATIENT_CLINIC_OR_DEPARTMENT_OTHER): Payer: Medicare PPO

## 2023-03-14 LAB — FUNGUS CULTURE WITH STAIN

## 2023-03-14 LAB — FUNGUS CULTURE RESULT

## 2023-03-14 LAB — FUNGAL ORGANISM REFLEX

## 2023-03-15 LAB — FUNGAL ORGANISM REFLEX

## 2023-03-15 LAB — FUNGUS CULTURE WITH STAIN

## 2023-03-15 LAB — FUNGUS CULTURE RESULT

## 2023-03-16 ENCOUNTER — Encounter: Payer: Self-pay | Admitting: Pulmonary Disease

## 2023-03-16 ENCOUNTER — Ambulatory Visit: Payer: Medicare PPO | Admitting: Pulmonary Disease

## 2023-03-16 VITALS — BP 130/70 | HR 77 | Ht 62.0 in | Wt 136.2 lb

## 2023-03-16 DIAGNOSIS — J984 Other disorders of lung: Secondary | ICD-10-CM | POA: Diagnosis not present

## 2023-03-16 DIAGNOSIS — J189 Pneumonia, unspecified organism: Secondary | ICD-10-CM

## 2023-03-16 NOTE — Progress Notes (Signed)
Synopsis: Referred in March 2024 for lung mass by [name], MD  Subjective:   PATIENT ID: Bruce Valdez GENDER: male DOB: 1934-12-30, MRN: [id number]  Chief Complaint  Patient presents with   Follow-up    F/up    This is an 88 year old gentleman Referred after having CT imaging of the chest completed on 01/18/2023.  Patient was found to have a large conglomerate of masses within the right chest measuring largest cross-section at 9.7 cm.  Additionally there was interlobular septal thickening concerning for lymphangitic spread and subcarinal adenopathy.  Patient has ongoing cough.  That is what led to his evaluation.  He otherwise has never been hospitalized in his life had intermittent asthma symptoms many years ago.  He takes no medications.  He is a lifelong non-smoker.  Has no family history of lung cancer and he came from [place] to the [place] after World War II.  OV 05/16/2023: Here today for evaluation of follow-up imaging.  He was taken for bronchoscopy for biopsy which was negative.  Culture results did show normal flora and gram-positive cocci.  He was treated with 28-day course of Augmentin.  Follow-up CT imaging completed last week shows resolution of the prior cavitary space still has areas of consolidation.  He has completed his 28-day course of antibiotics at this point.  No additional fevers.  No cough.  Symptoms have improved.    Past Medical History:  Diagnosis Date   Asthma    Hypertension    Nonsmoker      Family History  Problem Relation Age of Onset   Lung cancer Neg Hx      Past Surgical History:  Procedure Laterality Date   BRONCHIAL WASHINGS  02/06/2023   Procedure: BRONCHIAL WASHINGS;  Surgeon: Josephine Igo, DO;  Location: [facility];  Service: Pulmonary;;   COLONOSCOPY     FINE NEEDLE ASPIRATION  02/06/2023   Procedure: FINE NEEDLE ASPIRATION (FNA) LINEAR;  Surgeon: Josephine Igo, DO;  Location: [facility];  Service: Pulmonary;;    VIDEO BRONCHOSCOPY WITH ENDOBRONCHIAL ULTRASOUND Bilateral 02/06/2023   Procedure: VIDEO BRONCHOSCOPY WITH ENDOBRONCHIAL ULTRASOUND;  Surgeon: Josephine Igo, DO;  Location: [facility];  Service: Pulmonary;  Laterality: Bilateral;    Social History   Socioeconomic History   Marital status: Married    Spouse name: Not on file   Number of children: Not on file   Years of education: Not on file   Highest education level: Not on file  Occupational History   Not on file  Tobacco Use   Smoking status: Never    Passive exposure: Never   Smokeless tobacco: Never  Vaping Use   Vaping Use: Never used  Substance and Sexual Activity   Alcohol use: Not Currently   Drug use: Not Currently   Sexual activity: Not on file  Other Topics Concern   Not on file  Social History Narrative   Not on file   Social Determinants of Health   Financial Resource Strain: Not on file  Food Insecurity: Not on file  Transportation Needs: Not on file  Physical Activity: Not on file  Stress: Not on file  Social Connections: Not on file  Intimate Partner Violence: Not on file     No Known Allergies   Outpatient Medications Prior to Visit  Medication Sig Dispense Refill   acetaminophen (TYLENOL) 500 MG tablet Take 1,000 mg by mouth every 6 (six) hours as needed for mild pain or moderate pain.  dutasteride (AVODART) 0.5 MG capsule Take 0.5 mg by mouth daily.     Glucosamine-Chondroit-Vit C-Mn (GLUCOSAMINE CHONDR 1500 COMPLX PO) Take 1,500 mg by mouth daily.     metoprolol tartrate (LOPRESSOR) 50 MG tablet Take 50 mg by mouth 2 (two) times daily.     Multiple Vitamins-Minerals (MULTIVITAMIN WITH MINERALS) tablet Take 1 tablet by mouth daily. 50+     tamsulosin (FLOMAX) 0.4 MG CAPS capsule Take 0.4 mg by mouth daily.     promethazine-dextromethorphan (PROMETHAZINE-DM) 6.25-15 MG/5ML syrup Take 10 mLs by mouth every 8 (eight) hours as needed for cough. (Patient not taking: Reported on 03/16/2023)      No facility-administered medications prior to visit.    Review of Systems  Constitutional:  Negative for chills, fever, malaise/fatigue and weight loss.  HENT:  Negative for hearing loss, sore throat and tinnitus.   Eyes:  Negative for blurred vision and double vision.  Respiratory:  Negative for cough, hemoptysis, sputum production, shortness of breath, wheezing and stridor.   Cardiovascular:  Negative for chest pain, palpitations, orthopnea, leg swelling and PND.  Gastrointestinal:  Negative for abdominal pain, constipation, diarrhea, heartburn, nausea and vomiting.  Genitourinary:  Negative for dysuria, hematuria and urgency.  Musculoskeletal:  Negative for joint pain and myalgias.  Skin:  Negative for itching and rash.  Neurological:  Negative for dizziness, tingling, weakness and headaches.  Endo/Heme/Allergies:  Negative for environmental allergies. Does not bruise/bleed easily.  Psychiatric/Behavioral:  Negative for depression. The patient is not nervous/anxious and does not have insomnia.   All other systems reviewed and are negative.    Objective:  Physical Exam Vitals reviewed.  Constitutional:      General: He is not in acute distress.    Appearance: He is well-developed.  HENT:     Head: Normocephalic and atraumatic.  Eyes:     General: No scleral icterus.    Conjunctiva/sclera: Conjunctivae normal.     Pupils: Pupils are equal, round, and reactive to light.  Neck:     Vascular: No JVD.     Trachea: No tracheal deviation.  Cardiovascular:     Rate and Rhythm: Normal rate and regular rhythm.     Heart sounds: Normal heart sounds. No murmur heard. Pulmonary:     Effort: Pulmonary effort is normal. No tachypnea, accessory muscle usage or respiratory distress.     Breath sounds: No stridor. No wheezing, rhonchi or rales.  Abdominal:     General: There is no distension.     Palpations: Abdomen is soft.     Tenderness: There is no abdominal tenderness.   Musculoskeletal:        General: No tenderness.     Cervical back: Neck supple.  Lymphadenopathy:     Cervical: No cervical adenopathy.  Skin:    General: Skin is warm and dry.     Capillary Refill: Capillary refill takes less than 2 seconds.     Findings: No rash.  Neurological:     Mental Status: He is alert and oriented to person, place, and time.  Psychiatric:        Behavior: Behavior normal.      Vitals:   03/16/23 1505  BP: 130/70  Pulse: 77  SpO2: 96%  Weight: 136 lb 3.2 oz (61.8 kg)  Height: 5\' 2"  (1.575 m)   96% on RA BMI Readings from Last 3 Encounters:  03/16/23 24.91 kg/m  02/13/23 26.37 kg/m  02/06/23 25.42 kg/m   Wt Readings from Last 3 Encounters:  03/16/23 136 lb 3.2 oz (61.8 kg)  02/13/23 144 lb 3.2 oz (65.4 kg)  02/06/23 139 lb (63 kg)     CBC    Component Value Date/Time   WBC 17.6 Repeated and verified X2. (H) 01/26/2023 1038   RBC 3.59 (L) 01/26/2023 1038   HGB 10.3 (L) 01/26/2023 1038   HCT 32.9 (L) 01/26/2023 1038   PLT 376.0 01/26/2023 1038   MCV 91.8 01/26/2023 1038   MCHC 31.4 01/26/2023 1038   RDW 14.9 01/26/2023 1038   LYMPHSABS 1.6 01/26/2023 1038   MONOABS 1.2 (H) 01/26/2023 1038   EOSABS 0.1 01/26/2023 1038   BASOSABS 0.1 01/26/2023 1038     Chest Imaging:  CT chest February 2024: Right-sided lung mass with associated adenopathy concerning for advanced age bronchogenic carcinoma. The patient's images have been independently reviewed by me.    CT chest April 2024: Consolidation within the right lower lobe, cavitary disease improved. Overall the area compared to previous CT imaging has decreased in size. The patient's images have been independently reviewed by me.    Pulmonary Functions Testing Results:     No data to display          FeNO:   Pathology:   Echocardiogram:   Heart Catheterization:     Assessment & Plan:     ICD-10-CM   1. Cavitary lesion of lung  J98.4 CT CHEST WO CONTRAST    2.  Community acquired pneumonia of right lower lobe of lung  J18.9 CT CHEST WO CONTRAST      Discussion:  This is a 88 year old gentleman, newly diagnosed abnormality in the chest that was concerning for malignancy however bronchoscopy biopsy was negative culture results with normal flora and gram-positive cocci.  He was treated with a 28-day course of antimicrobials to include Augmentin and he improved.  Follow-up CT imaging reviewed today.  Plan: We will have a repeat noncontrast CT chest in 3 months. Reassuring that the area of concern in the right chest is decreasing in size. He was counseled on whether or not he develops new symptoms such as cough and fever. If this develops I need to know so we can make arrangements for consideration of repeat course of antibiotics or repeat imaging sooner. Patient is agreeable to this plan. RTC in 3 months after CT chest complete.    Current Outpatient Medications:    acetaminophen (TYLENOL) 500 MG tablet, Take 1,000 mg by mouth every 6 (six) hours as needed for mild pain or moderate pain., Disp: , Rfl:    dutasteride (AVODART) 0.5 MG capsule, Take 0.5 mg by mouth daily., Disp: , Rfl:    Glucosamine-Chondroit-Vit C-Mn (GLUCOSAMINE CHONDR 1500 COMPLX PO), Take 1,500 mg by mouth daily., Disp: , Rfl:    metoprolol tartrate (LOPRESSOR) 50 MG tablet, Take 50 mg by mouth 2 (two) times daily., Disp: , Rfl:    Multiple Vitamins-Minerals (MULTIVITAMIN WITH MINERALS) tablet, Take 1 tablet by mouth daily. 50+, Disp: , Rfl:    tamsulosin (FLOMAX) 0.4 MG CAPS capsule, Take 0.4 mg by mouth daily., Disp: , Rfl:    promethazine-dextromethorphan (PROMETHAZINE-DM) 6.25-15 MG/5ML syrup, Take 10 mLs by mouth every 8 (eight) hours as needed for cough. (Patient not taking: Reported on 03/16/2023), Disp: , Rfl:    Josephine Igo, DO [facility] 03/16/2023 3:33 PM

## 2023-03-16 NOTE — Patient Instructions (Signed)
Thank you for visiting Dr. Tonia Brooms at [facility]. Today we recommend the following:  Orders Placed This Encounter  Procedures   CT CHEST WO CONTRAST   Return in about 3 months (around 06/15/2023) for with Kandice Robinsons, NP, after CT Chest.    Please do your part to reduce the spread of COVID-19.

## 2023-03-23 LAB — ACID FAST CULTURE WITH REFLEXED SENSITIVITIES (MYCOBACTERIA)
Acid Fast Culture: NEGATIVE
Acid Fast Culture: NEGATIVE

## 2023-05-09 ENCOUNTER — Telehealth: Payer: Self-pay | Admitting: Pulmonary Disease

## 2023-05-09 NOTE — Telephone Encounter (Signed)
Patient would like to schedule CT scan. Patient phone number is [phone].

## 2023-05-10 NOTE — Telephone Encounter (Signed)
Patient aware of CT and OV

## 2023-06-08 ENCOUNTER — Ambulatory Visit (HOSPITAL_BASED_OUTPATIENT_CLINIC_OR_DEPARTMENT_OTHER)
Admission: RE | Admit: 2023-06-08 | Discharge: 2023-06-08 | Disposition: A | Discharge status: Home / Self Care | Payer: Medicare HMO | Source: Ambulatory Visit | Attending: Pulmonary Disease | Admitting: Pulmonary Disease

## 2023-06-08 ENCOUNTER — Ambulatory Visit (HOSPITAL_BASED_OUTPATIENT_CLINIC_OR_DEPARTMENT_OTHER): Payer: Medicare PPO

## 2023-06-08 DIAGNOSIS — J984 Other disorders of lung: Secondary | ICD-10-CM | POA: Insufficient documentation

## 2023-06-08 DIAGNOSIS — J189 Pneumonia, unspecified organism: Secondary | ICD-10-CM | POA: Insufficient documentation

## 2023-06-15 ENCOUNTER — Ambulatory Visit: Payer: Medicare HMO | Admitting: Acute Care

## 2023-06-15 ENCOUNTER — Encounter: Payer: Self-pay | Admitting: Acute Care

## 2023-06-15 VITALS — BP 118/74 | HR 58 | Temp 97.4°F | Ht 62.0 in | Wt 138.2 lb

## 2023-06-15 DIAGNOSIS — J181 Lobar pneumonia, unspecified organism: Secondary | ICD-10-CM

## 2023-06-15 DIAGNOSIS — J984 Other disorders of lung: Secondary | ICD-10-CM

## 2023-06-15 NOTE — Progress Notes (Signed)
History of Present Illness Bruce Valdez is a 88 y.o. male never smoker with RLL cavitary lesions on imaging 01/18/2023. Referred in March 2024 to see Dr. Tonia Brooms for lung mass by [name], MD   Synopsis 88 year old gentleman, newly diagnosed abnormality in the chest that was concerning for malignancy however bronchoscopy biopsy 02/06/2023 was negative culture results with normal flora and gram-positive cocci. He was treated with a 28-day course of antimicrobials to include Augmentin and he improved. Plan was for a short term follow up after completion of his    06/15/2023 Pt. Presents for follow up. He states he has been doing well. He has started regaining his weight. His cough is gone, no secretions . He is able to climb 4 flights of stair without getting short of breath. He is back to his baseline. We have reviewed his CT Chest, and discussed that the areas of concern have improved , and that he does have some post infectious scarring from the pneumonia.We will do a 6 month follow up CT Chest with follow up to ensure continued improvement. Pt. and his wife are in agreement with this plan. We discussed that they need to call if he develops a cough or any other symptoms similar to what he had before so we can treat him early he verbalized understanding. There was notation of coronary artery calcifications. Pt. States his PCP at [facility]. Practice did an echo. He has not heard results of this.I have asked them to follow up with PCP for results and seek cardiology referral if appropriate.    Test Results: CT Chest 06/13/2023 Significantly improved right lower lobe opacities are noted compared to prior exam, with mild reticular densities remaining, most consistent with post infectious scarring. Minimal left basilar scarring is noted as well. No definite acute pulmonary abnormality is noted.   Coronary artery calcifications are noted.   Aortic Atherosclerosis (ICD10-I70.0).    FINAL  MICROSCOPIC DIAGNOSIS:   B. LUNG, RLL MASS, FINE NEEDLE ASPIRATION:  - No malignant cells identified  - See comment  COMMENT:  Prominent acute inflammation is present - differential diagnosis can  include an abscess.   CT Chest 01/18/2023 Interval increase in size of subpleural mass(s) within the posterolateral right upper lobe and superior segment of the right lower lobe. These masses now converge resulting in a conglomerate mass measuring 9.7 x 5.6 x 6.0 cm. Findings are worrisome for progressive primary bronchogenic carcinoma. Recommend referral to multi disciplinary thoracic oncology clinic. Additionally further evaluation with PET-CT should be considered in the near future for staging purposes. New mild interlobular septal thickening surrounding the now conglomerate right lung mass which may reflect lymphangitic spread of disease. Interval increase in size of enlarged subcarinal lymph node. Coronary artery calcifications. Aortic Atherosclerosis (ICD10-I70.0).        Latest Ref Rng & Units 01/26/2023   10:38 AM  CBC  WBC 4.0 - 10.5 K/uL 17.6 Repeated and verified X2.   Hemoglobin 13.0 - 17.0 g/dL 16.1   Hematocrit 09.6 - 52.0 % 32.9   Platelets 150.0 - 400.0 K/uL 376.0        Latest Ref Rng & Units 01/26/2023   10:38 AM  BMP  Glucose 70 - 99 mg/dL 045   BUN 6 - 23 mg/dL 13   Creatinine 4.09 - 1.50 mg/dL 8.11   Sodium 914 - 782 mEq/L 135   Potassium 3.5 - 5.1 mEq/L 4.2   Chloride 96 - 112 mEq/L 101   CO2 19 -  32 mEq/L 28   Calcium 8.4 - 10.5 mg/dL 9.0     BNP No results found for: "BNP"  ProBNP No results found for: "PROBNP"  PFT No results found for: "FEV1PRE", "FEV1POST", "FVCPRE", "FVCPOST", "TLC", "DLCOUNC", "PREFEV1FVCRT", "PSTFEV1FVCRT"  CT CHEST WO CONTRAST  Result Date: 06/13/2023 CLINICAL DATA:  Pneumonia. EXAM: CT CHEST WITHOUT CONTRAST TECHNIQUE: Multidetector CT imaging of the chest was performed following the standard protocol without IV  contrast. RADIATION DOSE REDUCTION: This exam was performed according to the departmental dose-optimization program which includes automated exposure control, adjustment of the mA and/or kV according to patient size and/or use of iterative reconstruction technique. COMPARISON:  March 12, 2023. FINDINGS: Cardiovascular: Atherosclerosis of thoracic aorta is noted without aneurysm formation. Normal cardiac size. No pericardial effusion. Coronary artery calcifications are noted. Mediastinum/Nodes: No enlarged mediastinal or axillary lymph nodes. Thyroid gland, trachea, and esophagus demonstrate no significant findings. Lungs/Pleura: No pneumothorax or pleural effusion is noted. Mild reticular densities are noted in the right lower lobe most consistent with post infectious scarring. Minimal left basilar scarring is noted. Upper Abdomen: No acute abnormality. Musculoskeletal: No chest wall mass or suspicious bone lesions identified. IMPRESSION: Significantly improved right lower lobe opacities are noted compared to prior exam, with mild reticular densities remaining, most consistent with post infectious scarring. Minimal left basilar scarring is noted as well. No definite acute pulmonary abnormality is noted. Coronary artery calcifications are noted. Aortic Atherosclerosis (ICD10-I70.0). Electronically Signed   By: Lupita Raider M.D.   On: 06/13/2023 14:37     Past medical hx Past Medical History:  Diagnosis Date   Asthma    Hypertension    Nonsmoker      Social History   Tobacco Use   Smoking status: Never    Passive exposure: Never   Smokeless tobacco: Never  Vaping Use   Vaping status: Never Used  Substance Use Topics   Alcohol use: Not Currently   Drug use: Not Currently    [name] reports that he has never smoked. He has never been exposed to tobacco smoke. He has never used smokeless tobacco. He reports that he does not currently use alcohol. He reports that he does not currently use  drugs.  Tobacco Cessation: Never Smoker   Past surgical hx, Family hx, Social hx all reviewed.  Current Outpatient Medications on File Prior to Visit  Medication Sig   acetaminophen (TYLENOL) 500 MG tablet Take 1,000 mg by mouth every 6 (six) hours as needed for mild pain or moderate pain.   dutasteride (AVODART) 0.5 MG capsule Take 0.5 mg by mouth daily.   Glucosamine-Chondroit-Vit C-Mn (GLUCOSAMINE CHONDR 1500 COMPLX PO) Take 1,500 mg by mouth daily.   metoprolol tartrate (LOPRESSOR) 50 MG tablet Take 50 mg by mouth 2 (two) times daily.   Multiple Vitamins-Minerals (MULTIVITAMIN WITH MINERALS) tablet Take 1 tablet by mouth daily. 50+   tamsulosin (FLOMAX) 0.4 MG CAPS capsule Take 0.4 mg by mouth daily.   promethazine-dextromethorphan (PROMETHAZINE-DM) 6.25-15 MG/5ML syrup Take 10 mLs by mouth every 8 (eight) hours as needed for cough. (Patient not taking: Reported on 03/16/2023)   No current facility-administered medications on file prior to visit.     No Known Allergies  Review Of Systems:  Constitutional:   No  weight loss, night sweats,  Fevers, chills, fatigue, or  lassitude.  HEENT:   No headaches,  Difficulty swallowing,  Tooth/dental problems, or  Sore throat,  No sneezing, itching, ear ache, nasal congestion, post nasal drip,   CV:  No chest pain,  Orthopnea, PND, swelling in lower extremities, anasarca, dizziness, palpitations, syncope.   GI  No heartburn, indigestion, abdominal pain, nausea, vomiting, diarrhea, change in bowel habits, loss of appetite, bloody stools.   Resp: No shortness of breath with exertion or at rest.  No excess mucus, no productive cough,  No non-productive cough,  No coughing up of blood.  No change in color of mucus.  No wheezing.  No chest wall deformity  Skin: no rash or lesions.  GU: no dysuria, change in color of urine, no urgency or frequency.  No flank pain, no hematuria   MS:  No joint pain or swelling.  No decreased  range of motion.  No back pain.  Psych:  No change in mood or affect. No depression or anxiety.  No memory loss.   Vital Signs BP 118/74 (BP Location: Left Arm, Patient Position: Sitting, Cuff Size: Normal)   Pulse (!) 58   Temp (!) 97.4 F (36.3 C) (Temporal)   Ht 5\' 2"  (1.575 m)   Wt 138 lb 3.2 oz (62.7 kg)   SpO2 96%   BMI 25.28 kg/m    Physical Exam:  General- No distress,  A&Ox3, pleasant ENT: No sinus tenderness, TM clear, pale nasal mucosa, no oral exudate,no post nasal drip, no LAN Cardiac: S1, S2, regular rate and rhythm, no murmur Chest: No wheeze/ rales/ dullness; no accessory muscle use, no nasal flaring, no sternal retractions Abd.: Soft Non-tender, ND, BS +, Body mass index is 25.28 kg/m.  Ext: No clubbing cyanosis, edema Neuro:  Physically deconditioned, but improving strength, MAE x 4, A&O x 3 Skin: No rashes, warm and dry, no lesions  Psych: normal mood and behavior   Assessment/Plan RLL cavitary lesion with biopsy negative for malignant Cells in lifelong non-smoker Infection found on bronchoscopy 3/19 Treatment with Augmentin x 28 days started 3/19> Completed Improvement clinically and on CXR 05/2023 CAD on imaging Plan Your  CT Chest shows improvement of the areas of your lung we were concerned about. There is some post infectious scarring from the pneumonia.  Continue working on weight gain . Continue gentle exercise. Call [facility] to get the results of your heart Echo. Ask them for referral to cardiology if this is indicated from the Echo.  Please have them send a copy of the heart echo to [place] so we can scan it into the system.  If you develop symptoms like you experienced when you were sick in March, please call us to be seen so we can treat you. Follow up in 6 months with CT Chest prior to visit to review results. Please contact office for sooner follow up if symptoms do not improve or worsen or seek emergency care    I spent 33 minutes  dedicated to the care of this patient on the date of this encounter to include pre-visit review of records, face-to-face time with the patient discussing conditions above, post visit ordering of testing, clinical documentation with the electronic health record, making appropriate referrals as documented, and communicating necessary information to the patient's healthcare team.  . Bevelyn Ngo, NP 06/15/2023  9:29 AM

## 2023-06-15 NOTE — Patient Instructions (Addendum)
It is good to see you today. Your  CT Chest shows improvement of the areas of your lung we were concerned about. There is some post infectious scarring from the pneumonia.  Continue working on weight gain . Continue gentle exercise. Call [facility] to get the results of your heart Echo. Ask them for referral to cardiology if this is indicated from the Echo.  Please have them send a copy of the heart echo to [place] so we can scan it into the system.  If you develop symptoms like you experienced when you were sick in March, please call us to be seen so we can treat you. Follow up in 6 months with CT Chest prior to visit to review results. Please contact office for sooner follow up if symptoms do not improve or worsen or seek emergency care   .

## 2023-06-19 NOTE — Progress Notes (Signed)
CT imaging reviewed at office visit with SG   Thanks,  BLI  Josephine Igo, DO [facility] 06/19/2023 1:23 PM

## 2023-07-17 ENCOUNTER — Other Ambulatory Visit: Payer: Self-pay | Admitting: Family Medicine

## 2023-07-26 ENCOUNTER — Ambulatory Visit
Admission: RE | Admit: 2023-07-26 | Discharge: 2023-07-26 | Disposition: A | Discharge status: Home / Self Care | Payer: Medicare HMO | Source: Ambulatory Visit | Attending: Family Medicine | Admitting: Family Medicine

## 2023-09-07 ENCOUNTER — Encounter (HOSPITAL_BASED_OUTPATIENT_CLINIC_OR_DEPARTMENT_OTHER): Payer: Self-pay

## 2023-09-07 ENCOUNTER — Emergency Department (HOSPITAL_BASED_OUTPATIENT_CLINIC_OR_DEPARTMENT_OTHER)
Admission: EM | Admit: 2023-09-07 | Discharge: 2023-09-07 | Disposition: A | Discharge status: Home / Self Care | Payer: Medicare HMO | Attending: Emergency Medicine | Admitting: Emergency Medicine

## 2023-09-07 ENCOUNTER — Other Ambulatory Visit: Payer: Self-pay

## 2023-09-07 ENCOUNTER — Emergency Department (HOSPITAL_BASED_OUTPATIENT_CLINIC_OR_DEPARTMENT_OTHER): Payer: Medicare HMO

## 2023-09-07 DIAGNOSIS — M79605 Pain in left leg: Secondary | ICD-10-CM | POA: Diagnosis present

## 2023-09-07 DIAGNOSIS — M7122 Synovial cyst of popliteal space [Baker], left knee: Secondary | ICD-10-CM | POA: Insufficient documentation

## 2023-09-07 DIAGNOSIS — M7989 Other specified soft tissue disorders: Secondary | ICD-10-CM

## 2023-09-07 LAB — CBC WITH DIFFERENTIAL/PLATELET
Abs Immature Granulocytes: 0.02 10*3/uL (ref 0.00–0.07)
Basophils Absolute: 0 10*3/uL (ref 0.0–0.1)
Basophils Relative: 1 %
Eosinophils Absolute: 0.1 10*3/uL (ref 0.0–0.5)
Eosinophils Relative: 2 %
HCT: 37.4 % — ABNORMAL LOW (ref 39.0–52.0)
Hemoglobin: 12.4 g/dL — ABNORMAL LOW (ref 13.0–17.0)
Immature Granulocytes: 0 %
Lymphocytes Relative: 32 %
Lymphs Abs: 1.7 10*3/uL (ref 0.7–4.0)
MCH: 34.7 pg — ABNORMAL HIGH (ref 26.0–34.0)
MCHC: 33.2 g/dL (ref 30.0–36.0)
MCV: 104.8 fL — ABNORMAL HIGH (ref 80.0–100.0)
Monocytes Absolute: 0.6 10*3/uL (ref 0.1–1.0)
Monocytes Relative: 11 %
Neutro Abs: 3 10*3/uL (ref 1.7–7.7)
Neutrophils Relative %: 54 %
Platelets: 111 10*3/uL — ABNORMAL LOW (ref 150–400)
RBC: 3.57 MIL/uL — ABNORMAL LOW (ref 4.22–5.81)
RDW: 12.3 % (ref 11.5–15.5)
WBC: 5.5 10*3/uL (ref 4.0–10.5)
nRBC: 0 % (ref 0.0–0.2)

## 2023-09-07 LAB — COMPREHENSIVE METABOLIC PANEL
ALT: 21 U/L (ref 0–44)
AST: 35 U/L (ref 15–41)
Albumin: 3.5 g/dL (ref 3.5–5.0)
Alkaline Phosphatase: 57 U/L (ref 38–126)
Anion gap: 7 (ref 5–15)
BUN: 20 mg/dL (ref 8–23)
CO2: 28 mmol/L (ref 22–32)
Calcium: 9.1 mg/dL (ref 8.9–10.3)
Chloride: 103 mmol/L (ref 98–111)
Creatinine, Ser: 0.87 mg/dL (ref 0.61–1.24)
GFR, Estimated: >60 mL/min (ref >60)
Glucose, Bld: 122 mg/dL — ABNORMAL HIGH (ref 70–99)
Potassium: 4.9 mmol/L (ref 3.5–5.1)
Sodium: 138 mmol/L (ref 135–145)
Total Bilirubin: 1.8 mg/dL — ABNORMAL HIGH (ref 0.3–1.2)
Total Protein: 6.6 g/dL (ref 6.5–8.1)

## 2023-09-07 NOTE — Discharge Instructions (Signed)
You can take 500 mg of Tylenol every 4-6 hours, 400 mg of ibuprofen every 6 hours.  Please follow-up with your primary care doctor this week.  You can tell them that you were diagnosed with a Baker's cyst in your leg.  Return to the emergency department if you develop increasing pain in your leg, inability to move your leg, numbness or tingling in your leg.

## 2023-09-07 NOTE — ED Notes (Signed)
Discharge paperwork reviewed entirely with patient, including follow up care. Pain was under control. No prescriptions were called in, but all questions were addressed.  Pt verbalized understanding as well as all parties involved. No questions or concerns voiced at the time of discharge. No acute distress noted.   Pt ambulated out to PVA without incident or assistance.  Pt advised they will notify their PCP. , Pt advised they will seek followup care with a specialist and notify their PCP. , and Pt was given information to obtain and notify a PCP.

## 2023-09-07 NOTE — ED Triage Notes (Signed)
The patient is having left leg swelling for two weeks with some pain.

## 2023-09-07 NOTE — ED Notes (Addendum)
US at bedside

## 2023-09-07 NOTE — ED Provider Notes (Signed)
[place] [facility] Provider Note   CSN: [id number] Arrival date & time: 09/07/23  1213     History  Chief Complaint  Patient presents with   Leg Swelling    Bruce Valdez is a 88 y.o. male.  This is a spry 88 year old male here today with left leg pain and swelling.  The symptoms began about 2 weeks ago, the patient was concerned that he may have pulled a muscle.  He does not have any history of blood clots.        Home Medications Prior to Admission medications   Medication Sig Start Date End Date Taking? Authorizing Provider  acetaminophen (TYLENOL) 500 MG tablet Take 1,000 mg by mouth every 6 (six) hours as needed for mild pain or moderate pain.    [provider]  dutasteride (AVODART) 0.5 MG capsule Take 0.5 mg by mouth daily. 12/11/22   [provider]  Glucosamine-Chondroit-Vit C-Mn (GLUCOSAMINE CHONDR 1500 COMPLX PO) Take 1,500 mg by mouth daily.    [provider]  metoprolol tartrate (LOPRESSOR) 50 MG tablet Take 50 mg by mouth 2 (two) times daily.    [provider]  Multiple Vitamins-Minerals (MULTIVITAMIN WITH MINERALS) tablet Take 1 tablet by mouth daily. 50+    [provider]  promethazine-dextromethorphan (PROMETHAZINE-DM) 6.25-15 MG/5ML syrup Take 10 mLs by mouth every 8 (eight) hours as needed for cough. Patient not taking: Reported on 03/16/2023 01/17/23   [provider]  tamsulosin (FLOMAX) 0.4 MG CAPS capsule Take 0.4 mg by mouth daily. 12/11/22   [provider]      Allergies    Patient has no known allergies.    Review of Systems   Review of Systems  Physical Exam Updated Vital Signs BP (!) 121/53   Pulse (!) 59   Temp 98.3 F (36.8 C) (Oral)   Resp 19   Ht 5\' 2"  (1.575 m)   Wt 66.5 kg   SpO2 96%   BMI 26.83 kg/m  Physical Exam Vitals reviewed.  Cardiovascular:     Pulses: Normal pulses.     Heart sounds: Normal heart sounds.      Comments: Lower extremity swelling from the midfoot to the calf.  No swelling of the thigh Skin:    General: Skin is warm and dry.     Findings: No rash.  Neurological:     Mental Status: He is alert.     ED Results / Procedures / Treatments   Labs (all labs ordered are listed, but only abnormal results are displayed) Labs Reviewed  CBC WITH DIFFERENTIAL/PLATELET - Abnormal; Notable for the following components:      Result Value   RBC 3.57 (*)    Hemoglobin 12.4 (*)    HCT 37.4 (*)    MCV 104.8 (*)    MCH 34.7 (*)    Platelets 111 (*)    All other components within normal limits  COMPREHENSIVE METABOLIC PANEL - Abnormal; Notable for the following components:   Glucose, Bld 122 (*)    Total Bilirubin 1.8 (*)    All other components within normal limits    EKG None  Radiology US Venous Img Lower  Left (DVT Study)  Result Date: 09/07/2023 CLINICAL DATA:  Left lower extremity pain and edema for the past 2 weeks. Evaluate for DVT. EXAM: LEFT LOWER EXTREMITY VENOUS DOPPLER ULTRASOUND TECHNIQUE: Gray-scale sonography with graded compression, as well as color Doppler and duplex ultrasound were performed to  evaluate the lower extremity deep venous systems from the level of the common femoral vein and including the common femoral, femoral, profunda femoral, popliteal and calf veins including the posterior tibial, peroneal and gastrocnemius veins when visible. The superficial great saphenous vein was also interrogated. Spectral Doppler was utilized to evaluate flow at rest and with distal augmentation maneuvers in the common femoral, femoral and popliteal veins. COMPARISON:  None Available. FINDINGS: Contralateral Common Femoral Vein: Respiratory phasicity is normal and symmetric with the symptomatic side. No evidence of thrombus. Normal compressibility. Common Femoral Vein: No evidence of thrombus. Normal compressibility, respiratory phasicity and response to augmentation. Saphenofemoral  Junction: No evidence of thrombus. Normal compressibility and flow on color Doppler imaging. Profunda Femoral Vein: No evidence of thrombus. Normal compressibility and flow on color Doppler imaging. Femoral Vein: No evidence of thrombus. Normal compressibility, respiratory phasicity and response to augmentation. Popliteal Vein: No evidence of thrombus. Normal compressibility, respiratory phasicity and response to augmentation. Calf Veins: No evidence of thrombus. Normal compressibility and flow on color Doppler imaging. Superficial Great Saphenous Vein: No evidence of thrombus. Normal compressibility. Other Findings: There is a 2.5 x 1.9 x 0.4 cm serpiginous fluid collection with the left popliteal fossa compatible with a Baker's cyst (images 21 through 24). There is a moderate amount of subcutaneous edema at the level of the left lower leg and calf. Note is made of a prominent though patent varicosity at the level of the left calf (image 37). IMPRESSION: No evidence of DVT within the left lower extremity. Electronically Signed   By: Simonne Come M.D.   On: 09/07/2023 14:00    Procedures Procedures    Medications Ordered in ED Medications - No data to display  ED Course/ Medical Decision Making/ A&P                                 Medical Decision Making 88 year old male here today for left leg swelling.  Differential diagnoses include DVT, less likely arterial injury, less likely traumatic injury.  Plan-will obtain ultrasound imaging of the patient's left lower extremity.  Basic labs ordered.  My suspicion is for DVT based on the presentation.  No evidence of arterial injury.  Exam not consistent with cellulitis.  No trauma to the area, no significant pain.  Reassessment-2:28 PM.  Patient has a Baker's cyst.  This likely explains the mild swelling that he has in his left leg.  He is going to follow-up with his primary care doctor.  Amount and/or Complexity of Data Reviewed Labs:  ordered.           Final Clinical Impression(s) / ED Diagnoses Final diagnoses:  Left leg swelling  Baker cyst, left    Rx / DC Orders ED Discharge Orders     None         Anders Simmonds T, DO 09/07/23 1429

## 2023-09-13 ENCOUNTER — Ambulatory Visit
Admission: RE | Admit: 2023-09-13 | Discharge: 2023-09-13 | Disposition: A | Discharge status: Home / Self Care | Payer: Medicare HMO | Source: Ambulatory Visit | Attending: Family Medicine | Admitting: Family Medicine

## 2023-09-13 ENCOUNTER — Other Ambulatory Visit: Payer: Self-pay | Admitting: Family Medicine

## 2023-09-13 DIAGNOSIS — M79605 Pain in left leg: Secondary | ICD-10-CM

## 2023-12-14 ENCOUNTER — Ambulatory Visit (HOSPITAL_BASED_OUTPATIENT_CLINIC_OR_DEPARTMENT_OTHER)
Admission: RE | Admit: 2023-12-14 | Discharge: 2023-12-14 | Disposition: A | Discharge status: Home / Self Care | Payer: Medicare HMO | Source: Ambulatory Visit | Attending: Acute Care | Admitting: Acute Care

## 2023-12-14 DIAGNOSIS — J181 Lobar pneumonia, unspecified organism: Secondary | ICD-10-CM | POA: Diagnosis present

## 2024-01-16 ENCOUNTER — Ambulatory Visit (INDEPENDENT_AMBULATORY_CARE_PROVIDER_SITE_OTHER): Payer: Medicare HMO | Admitting: Acute Care

## 2024-01-16 ENCOUNTER — Encounter: Payer: Self-pay | Admitting: Acute Care

## 2024-01-16 VITALS — BP 128/60 | HR 67 | Temp 97.8°F | Ht 62.0 in | Wt 150.4 lb

## 2024-01-16 DIAGNOSIS — R911 Solitary pulmonary nodule: Secondary | ICD-10-CM

## 2024-01-16 DIAGNOSIS — Z789 Other specified health status: Secondary | ICD-10-CM | POA: Diagnosis not present

## 2024-01-16 NOTE — Patient Instructions (Addendum)
 It is good to see you today. Your CT Chest is stable. This is great news. We will do a follow up chest CT in 1 year to ensure you are doing well.  You will get a call to get this scheduled. You will follow up here in the office to review th results after the scan. You will get a call to schedule for follow up appointment. Call if you need [place] sooner. Please contact office for sooner follow up if symptoms do not improve or worsen or seek emergency care

## 2024-01-16 NOTE — Progress Notes (Signed)
 History of Present Illness Bruce Valdez is a 89 y.o. male never smoker with RLL cavitary lesions on imaging 01/18/2023. Referred in March 2024 to see Dr. Tonia Brooms for lung mass by [name], MD   Synopsis 89 year old gentleman, newly diagnosed abnormality in the chest that was concerning for malignancy however bronchoscopy biopsy 02/06/2023 was negative culture results with normal flora and gram-positive cocci. He was treated with a 28-day course of antimicrobials to include Augmentin and he improved. Plan was for a short term follow up after completion of treatment. He is her today to review the 12/14/2023.    01/16/2024 Pt. Presents for follow up after repeat chest imaging to monitor pulmonary nodule. He states he has been doing well. NO breathing issues. He has an albuterol inhaler he never uses, and he has not had to use his  Advair in the last 3 years. He finds  as he has aged his asthma is better.  We have reviewed the patient CT results.  CT of the chest shows stable scarring, no acute pulmonary issues, no concerning pulmonary nodules.  There is also notation of coronary artery disease.  Patient states that while last year he was sick and had a hard time in regard to his lungs this last year has been a much better interval. As CT chest is stable we will plan for a 1 year follow-up CT chest without contrast as continued surveillance of this pulmonary nodule.  Both patient and his wife are in agreement with this plan.  Test Results: CT Chest 01/18/2023 Interval increase in size of subpleural mass(s) within the posterolateral right upper lobe and superior segment of the right lower lobe. These masses now converge resulting in a conglomerate mass measuring 9.7 x 5.6 x 6.0 cm. Findings are worrisome for progressive primary bronchogenic carcinoma. Recommend referral to multi disciplinary thoracic oncology clinic. Additionally further evaluation with PET-CT should be considered in the near  future for staging purposes. New mild interlobular septal thickening surrounding the now conglomerate right lung mass which may reflect lymphangitic spread of disease. Interval increase in size of enlarged subcarinal lymph node. Coronary artery calcifications. Aortic Atherosclerosis (ICD10-I70.0).  Cytology 02/06/2024  LUNG, RLL MASS, FINE NEEDLE ASPIRATION:  - No malignant cells identified  - See comment   CT Chest 06/08/2023 Significantly improved right lower lobe opacities are noted compared to prior exam, with mild reticular densities remaining, most consistent with post infectious scarring. Minimal left basilar scarring is noted as well. No definite acute pulmonary abnormality is noted.  CT Chest 12/22/2023  No acute findings within the chest. Chronic subsegmental atelectasis/scar noted within the posterior lingula which appears unchanged from the previous exam. Residual scarring with architectural distortion and volume loss involving the anterior right lower lobe, superior segment of right lower lobe and posterior basal left lower lobe. The appearance is unchanged compared with the previous exam. 4. No suspicious pulmonary nodule or mass identified at this time. 5. Coronary artery calcifications. 6.  Aortic Atherosclerosis (ICD10-I70.0).        Latest Ref Rng & Units 09/07/2023    1:05 PM 01/26/2023   10:38 AM  CBC  WBC 4.0 - 10.5 K/uL 5.5  17.6 Repeated and verified X2.   Hemoglobin 13.0 - 17.0 g/dL 81.1  91.4   Hematocrit 39.0 - 52.0 % 37.4  32.9   Platelets 150 - 400 K/uL 111  376.0        Latest Ref Rng & Units 09/07/2023    1:05 PM  01/26/2023   10:38 AM  BMP  Glucose 70 - 99 mg/dL 161  096   BUN 8 - 23 mg/dL 20  13   Creatinine 0.45 - 1.24 mg/dL 4.09  8.11   Sodium 914 - 145 mmol/L 138  135   Potassium 3.5 - 5.1 mmol/L 4.9  4.2   Chloride 98 - 111 mmol/L 103  101   CO2 22 - 32 mmol/L 28  28   Calcium 8.9 - 10.3 mg/dL 9.1  9.0     BNP No results found  for: "BNP"  ProBNP No results found for: "PROBNP"  PFT No results found for: "FEV1PRE", "FEV1POST", "FVCPRE", "FVCPOST", "TLC", "DLCOUNC", "PREFEV1FVCRT", "PSTFEV1FVCRT"  No results found.   Past medical hx Past Medical History:  Diagnosis Date   Asthma    Hypertension    Nonsmoker      Social History   Tobacco Use   Smoking status: Never    Passive exposure: Never   Smokeless tobacco: Never  Vaping Use   Vaping status: Never Used  Substance Use Topics   Alcohol use: Not Currently   Drug use: Not Currently    Mr.Spivack reports that he has never smoked. He has never been exposed to tobacco smoke. He has never used smokeless tobacco. He reports that he does not currently use alcohol. He reports that he does not currently use drugs.  Tobacco Cessation: Never smoker   Past surgical hx, Family hx, Social hx all reviewed.  Current Outpatient Medications on File Prior to Visit  Medication Sig   acetaminophen (TYLENOL) 500 MG tablet Take 1,000 mg by mouth every 6 (six) hours as needed for mild pain or moderate pain.   dutasteride (AVODART) 0.5 MG capsule Take 0.5 mg by mouth daily.   Glucosamine-Chondroit-Vit C-Mn (GLUCOSAMINE CHONDR 1500 COMPLX PO) Take 1,500 mg by mouth daily.   metoprolol tartrate (LOPRESSOR) 25 MG tablet Take 25 mg by mouth 2 (two) times daily.   Multiple Vitamins-Minerals (MULTIVITAMIN WITH MINERALS) tablet Take 1 tablet by mouth daily. 50+   tamsulosin (FLOMAX) 0.4 MG CAPS capsule Take 0.4 mg by mouth daily.   metoprolol tartrate (LOPRESSOR) 50 MG tablet Take 50 mg by mouth 2 (two) times daily. (Patient not taking: Reported on 01/16/2024)   promethazine-dextromethorphan (PROMETHAZINE-DM) 6.25-15 MG/5ML syrup Take 10 mLs by mouth every 8 (eight) hours as needed for cough. (Patient not taking: Reported on 03/16/2023)   No current facility-administered medications on file prior to visit.     No Known Allergies  Review Of Systems:  Constitutional:   No   weight loss, night sweats,  Fevers, chills, fatigue, or  lassitude.  HEENT:   No headaches,  Difficulty swallowing,  Tooth/dental problems, or  Sore throat,                No sneezing, itching, ear ache, nasal congestion, post nasal drip,   CV:  No chest pain,  Orthopnea, PND, swelling in lower extremities, anasarca, dizziness, palpitations, syncope.   GI  No heartburn, indigestion, abdominal pain, nausea, vomiting, diarrhea, change in bowel habits, loss of appetite, bloody stools.   Resp: No shortness of breath with exertion or at rest.  No excess mucus, no productive cough,  No non-productive cough,  No coughing up of blood.  No change in color of mucus.  No wheezing.  No chest wall deformity  Skin: no rash or lesions.  GU: no dysuria, change in color of urine, no urgency or frequency.  No flank pain,  no hematuria   MS:  No joint pain or swelling.  No decreased range of motion.  No back pain.  Psych:  No change in mood or affect. No depression or anxiety.  No memory loss.   Vital Signs BP 128/60 (BP Location: Left Arm, Patient Position: Sitting, Cuff Size: Normal)   Pulse 67   Temp 97.8 F (36.6 C) (Oral)   Ht 5\' 2"  (1.575 m)   Wt 150 lb 6.4 oz (68.2 kg)   SpO2 100%   BMI 27.51 kg/m    Physical Exam:  General- No distress,  A&Ox3, pleasant ENT: No sinus tenderness, TM clear, pale nasal mucosa, no oral exudate,no post nasal drip, no LAN Cardiac: S1, S2, regular rate and rhythm, no murmur Chest: No wheeze/ rales/ dullness; no accessory muscle use, no nasal flaring, no sternal retractions, lightly diminished per bases Abd.: Soft Non-tender, nondistended, bowel sounds positive,Body mass index is 27.51 kg/m.  Ext: No clubbing cyanosis, edema, no obvious deformities Neuro:  normal strength, moving all extremities x 4, alert and oriented x 3, appropriate Skin: No rashes, warm and dry, no obvious lesions Psych: normal mood and behavior   Assessment/Plan  Stable pulmonary  nodule and scarring per CT Chest No concerning lung nodules or acute issues Never smoker  Plan Your CT Chest is stable. This is great news. We will do a follow up chest CT in 1 year to ensure you are doing well.  You will get a call to get this scheduled. You will follow up here in the office to review th results after the scan. You will get a call to schedule for follow up appointment. Call if you need [place] sooner. Please contact office for sooner follow up if symptoms do not improve or worsen or seek emergency care    I spent 20 minutes dedicated to the care of this patient on the date of this encounter to include pre-visit review of records, face-to-face time with the patient discussing conditions above, post visit ordering of testing, clinical documentation with the electronic health record, making appropriate referrals as documented, and communicating necessary information to the patient's healthcare team.    Bevelyn Ngo, NP 01/16/2024  9:54 AM

## 2024-03-06 ENCOUNTER — Other Ambulatory Visit: Payer: Self-pay | Admitting: *Deleted

## 2024-03-06 DIAGNOSIS — M7989 Other specified soft tissue disorders: Secondary | ICD-10-CM

## 2024-03-17 NOTE — Progress Notes (Signed)
 Office Note     CC:  Left leg swelling with normal venous study  Requesting Provider:  Lorella Roles, MD  HPI: Bruce Valdez is a 89 y.o. (Jan 08, 1935) male who presents at the request of Paliwal, Himanshu, MD for evaluation of bilateral lower extremity edema.  On exam, Bruce Valdez was doing well, accompanied by his wife.  Originally from [place], he immigrated to [place]  in the 50s.  He was in [employer] business for years, first in [place], then in [place] prior to moving to [place] for retirement.  Both he and his wife moved to [place] to be closer to their son.  He has appreciated bilateral lower extremity edema for a number of years.  It is worsened over the last year.  He has had a DVT study which is negative.  Denies wounds. When he has not worn compression stockings in the past.  He has been worked up for [employer] study.  Denies symptoms of claudication, ischemic rest pain, tissue loss. Remains active, and walks on a daily basis with his wife. No history of venous procedures.   Past Medical History:  Diagnosis Date   Asthma    Hypertension    Nonsmoker     Past Surgical History:  Procedure Laterality Date   BRONCHIAL WASHINGS  02/06/2023   Procedure: BRONCHIAL WASHINGS;  Surgeon: Prudy Brownie, DO;  Location: [facility];  Service: Pulmonary;;   COLONOSCOPY     FINE NEEDLE ASPIRATION  02/06/2023   Procedure: FINE NEEDLE ASPIRATION (FNA) LINEAR;  Surgeon: Prudy Brownie, DO;  Location: [facility];  Service: Pulmonary;;   VIDEO BRONCHOSCOPY WITH ENDOBRONCHIAL ULTRASOUND Bilateral 02/06/2023   Procedure: VIDEO BRONCHOSCOPY WITH ENDOBRONCHIAL ULTRASOUND;  Surgeon: Prudy Brownie, DO;  Location: [facility];  Service: Pulmonary;  Laterality: Bilateral;    Social History   Socioeconomic History   Marital status: Married    Spouse name: Not on file   Number of children: Not on file   Years of education: Not on file   Highest education level:  Not on file  Occupational History   Not on file  Tobacco Use   Smoking status: Never    Passive exposure: Never   Smokeless tobacco: Never  Vaping Use   Vaping status: Never Used  Substance and Sexual Activity   Alcohol use: Not Currently   Drug use: Not Currently   Sexual activity: Not on file  Other Topics Concern   Not on file  Social History Narrative   Not on file   Social Drivers of Health   Financial Resource Strain: Low Risk  (01/30/2023)   Received from [facility], [facility]   Overall Financial Resource Strain (CARDIA)    Difficulty of Paying Living Expenses: Not hard at all  Food Insecurity: No Food Insecurity (01/30/2023)   [facility] from [facility], [facility]   Hunger Vital Sign    Worried About Running Out of Food in the Last Year: Never true    Ran Out of Food in the Last Year: Never true  Transportation Needs: No Transportation Needs (01/30/2023)   Received from [facility], [facility]   PRAPARE - Transportation    Lack of Transportation (Medical): No    Lack of Transportation (Non-Medical): No  Physical Activity: Sufficiently Active (01/29/2023)   Received from [facility], [facility]   Exercise Vital Sign    Days of Exercise per Week: 7 days    Minutes of Exercise per Session: 30 min  Stress: No Stress Concern Present (01/29/2023)   Received from [facility], [facility] Occupational [facility] - Occupational Stress Questionnaire    Feeling of Stress : Not at all  Social Connections: Socially Integrated (01/29/2023)   [facility]   Social Network    How would you rate your social network (family, work, friends)?: Good participation with social networks  Intimate Partner Violence: Unknown (01/29/2023)   Received from [facility], [facility]   HITS    Physically Hurt: Not on file    Over the last 12 months how often did your partner insult you or talk down to you?: Never     Over the last 12 months how often did your partner threaten you with physical harm?: Never    Over the last 12 months how often did your partner scream or curse at you?: Never   Family History  Problem Relation Age of Onset   Lung cancer Neg Hx     Current Outpatient Medications  Medication Sig Dispense Refill   acetaminophen (TYLENOL) 500 MG tablet Take 1,000 mg by mouth every 6 (six) hours as needed for mild pain or moderate pain.     dutasteride (AVODART) 0.5 MG capsule Take 0.5 mg by mouth daily.     Glucosamine-Chondroit-Vit C-Mn (GLUCOSAMINE CHONDR 1500 COMPLX PO) Take 1,500 mg by mouth daily.     metoprolol tartrate (LOPRESSOR) 25 MG tablet Take 25 mg by mouth 2 (two) times daily.     metoprolol tartrate (LOPRESSOR) 50 MG tablet Take 50 mg by mouth 2 (two) times daily. (Patient not taking: Reported on 01/16/2024)     Multiple Vitamins-Minerals (MULTIVITAMIN WITH MINERALS) tablet Take 1 tablet by mouth daily. 50+     promethazine-dextromethorphan (PROMETHAZINE-DM) 6.25-15 MG/5ML syrup Take 10 mLs by mouth every 8 (eight) hours as needed for cough. (Patient not taking: Reported on 03/16/2023)     tamsulosin (FLOMAX) 0.4 MG CAPS capsule Take 0.4 mg by mouth daily.     No current facility-administered medications for this visit.    No Known Allergies   REVIEW OF SYSTEMS:  [X]  denotes positive finding, [ ]  denotes negative finding Cardiac  Comments:  Chest pain or chest pressure:    Shortness of breath upon exertion:    Short of breath when lying flat:    Irregular heart rhythm:        Vascular    Pain in calf, thigh, or hip brought on by ambulation:    Pain in feet at night that wakes you up from your sleep:     Blood clot in your veins:    Leg swelling:         Pulmonary    Oxygen at home:    Productive cough:     Wheezing:         Neurologic    Sudden weakness in arms or legs:     Sudden numbness in arms or legs:     Sudden onset of difficulty speaking or slurred  speech:    Temporary loss of vision in one eye:     Problems with dizziness:         Gastrointestinal    Blood in stool:     Vomited blood:         Genitourinary    Burning when urinating:     Blood in urine:        Psychiatric    Major depression:  Hematologic    Bleeding problems:    Problems with blood clotting too easily:        Skin    Rashes or ulcers:        Constitutional    Fever or chills:      PHYSICAL EXAMINATION:  There were no vitals filed for this visit.  General:  WDWN in NAD; vital signs documented above Gait: Not observed HENT: WNL, normocephalic Pulmonary: normal non-labored breathing , without Rales, rhonchi,  wheezing Cardiac: regular HR Abdomen: soft, NT, no masses Skin: without rashes Vascular Exam/Pulses:  Right Left  Radial 2+ (normal) 2+ (normal)  Ulnar    Femoral    Popliteal    DP 2+ (normal) 2+ (normal)  PT     Extremities: without ischemic changes, without Gangrene , without cellulitis; without open wounds;  Bilateral lower extremity edema.  Lipodermatosclerosis Musculoskeletal: no muscle wasting or atrophy  Neurologic: A&O X 3;  No focal weakness or paresthesias are detected Psychiatric:  The pt has Normal affect.   Non-Invasive Vascular Imaging:   FINDINGS: Contralateral Common Femoral Vein: Respiratory phasicity is normal and symmetric with the symptomatic side. No evidence of thrombus. Normal compressibility.   Common Femoral Vein: No evidence of thrombus. Normal compressibility, respiratory phasicity and response to augmentation.   Saphenofemoral Junction: No evidence of thrombus. Normal compressibility and flow on color Doppler imaging.   Profunda Femoral Vein: No evidence of thrombus. Normal compressibility and flow on color Doppler imaging.   Femoral Vein: No evidence of thrombus. Normal compressibility, respiratory phasicity and response to augmentation.   Popliteal Vein: No evidence of thrombus.  Normal compressibility, respiratory phasicity and response to augmentation.   Calf Veins: No evidence of thrombus. Normal compressibility and flow on color Doppler imaging.   Superficial Great Saphenous Vein: No evidence of thrombus. Normal compressibility.   Other Findings: There is a 2.5 x 1.9 x 0.4 cm serpiginous fluid collection with the left popliteal fossa compatible with a Baker's cyst (images 21 through 24). There is a moderate amount of subcutaneous edema at the level of the left lower leg and calf.   Note is made of a prominent though patent varicosity at the level of the left calf (image 37).   IMPRESSION: No evidence of DVT within the left lower extremity.  +--------------+---------+------+-----------+------------+--------+  LEFT         Reflux NoRefluxReflux TimeDiameter cmsComments                          Yes                                   +--------------+---------+------+-----------+------------+--------+  CFV                    yes   >1 second                       +--------------+---------+------+-----------+------------+--------+  FV mid                  yes   >1 second                       +--------------+---------+------+-----------+------------+--------+  Popliteal              yes   >1 second                       +--------------+---------+------+-----------+------------+--------+  GSV at [facility]              yes    >500 ms      1.35              +--------------+---------+------+-----------+------------+--------+  GSV prox thigh          yes    >500 ms      1.05              +--------------+---------+------+-----------+------------+--------+  GSV mid thigh           yes    >500 ms      0.95    tortuous  +--------------+---------+------+-----------+------------+--------+  GSV dist thigh          yes    >500 ms      0.62    tortuous   +--------------+---------+------+-----------+------------+--------+  GSV at knee             yes    >500 ms      0.81              +--------------+---------+------+-----------+------------+--------+  GSV prox calf           yes    >500 ms      0.59              +--------------+---------+------+-----------+------------+--------+  SSV Pop Fossa no                            0.25              +--------------+---------+------+-----------+------------+--------+    ASSESSMENT/PLAN:: 89 y.o. male presenting with bilateral lower extremity edema and skin changes consistent with chronic venous insufficiency.  On physical exam, Tameem had palpable pedal pulses.  Bilateral lower extremity lipodermatosclerosis at the level of the calves. CEAP4 Left lower extremity reflux ultrasound demonstrated venous reflux in the superficial and deep systems.  I had a long conversation with him regarding the above.  We discussed the natural history of venous insufficiency, and most notably continued swelling and edema which comes from venous hypertension.  While he does note swelling in his legs, he states he is relatively asymptomatic, and does not affect his lifestyle.  We discussed the importance of continued exercise, elevation when able, and compression stockings.  He was measured for compression stockings in my office today. Being that he is relatively asymptomatic, I do not think he would have significant improvement with greater saphenous vein ablation.   Should his symptoms worsen, I am happy to see him back in the office to discuss venous ablation.    Kayla Part, MD [facility] [phone]

## 2024-03-20 ENCOUNTER — Encounter: Payer: Self-pay | Admitting: Vascular Surgery

## 2024-03-20 ENCOUNTER — Ambulatory Visit (HOSPITAL_COMMUNITY)
Admission: RE | Admit: 2024-03-20 | Discharge: 2024-03-20 | Disposition: A | Discharge status: Home / Self Care | Source: Ambulatory Visit | Attending: Vascular Surgery | Admitting: Vascular Surgery

## 2024-03-20 ENCOUNTER — Ambulatory Visit: Attending: Vascular Surgery | Admitting: Vascular Surgery

## 2024-03-20 VITALS — BP 165/78 | HR 64 | Temp 98.2°F | Resp 18 | Ht 62.0 in | Wt 151.4 lb

## 2024-03-20 DIAGNOSIS — I872 Venous insufficiency (chronic) (peripheral): Secondary | ICD-10-CM | POA: Diagnosis not present

## 2024-03-20 DIAGNOSIS — M7989 Other specified soft tissue disorders: Secondary | ICD-10-CM | POA: Insufficient documentation

## 2024-08-18 ENCOUNTER — Ambulatory Visit (HOSPITAL_BASED_OUTPATIENT_CLINIC_OR_DEPARTMENT_OTHER)
Admission: RE | Admit: 2024-08-18 | Discharge: 2024-08-18 | Disposition: A | Discharge status: Home / Self Care | Source: Ambulatory Visit | Attending: Acute Care | Admitting: Acute Care

## 2024-08-18 DIAGNOSIS — R911 Solitary pulmonary nodule: Secondary | ICD-10-CM | POA: Insufficient documentation
